# Patient Record
Sex: Male | Born: 1990 | Race: White | Hispanic: No | Marital: Single | Smoking: Current every day smoker
Health system: Southern US, Community
[De-identification: ages and names within clinical notes are randomized; demographics above are authoritative.]

## PROBLEM LIST (undated history)

## (undated) ENCOUNTER — Emergency Department (HOSPITAL_COMMUNITY): Admission: EM | Payer: Self-pay | Source: Home / Self Care

---
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
## 2010-06-13 ENCOUNTER — Emergency Department (HOSPITAL_COMMUNITY)
Admission: EM | Admit: 2010-06-13 | Discharge: 2010-06-13 | Payer: Self-pay | Source: Home / Self Care | Admitting: Emergency Medicine

## 2010-10-10 IMAGING — CT CT PARANASAL SINUSES LIMITED
1 of 2 series · 15 of 30 positions shown, 19 images · non-contrast
Comparison: None.

CLINICAL DATA: 18-year-old male with pain around the eyes for 1
week.  No known injury.

CT PARANASAL SINUS LIMITED WITHOUT CONTRAST
TECHNIQUE: Multidetector CT images of the paranasal sinuses were
obtained in a single plane without contrast.

[Series 3: ltd sinuses 3.0 h40s st · axial · 0.29mm/px · z∈[-214,-136]mm · 15 of 30 slices shown, 19 images]
[im 2/30  brain]
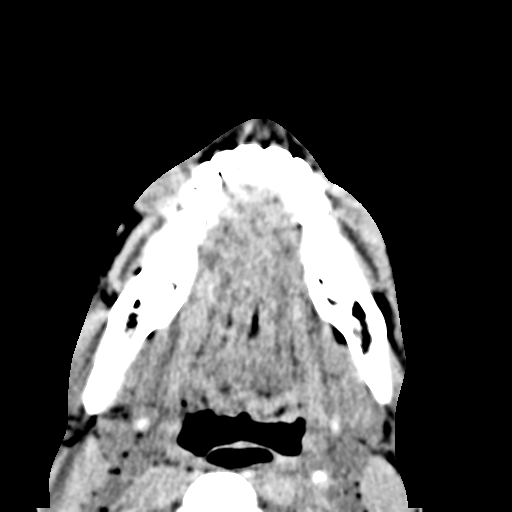
[im 2/30  bone]
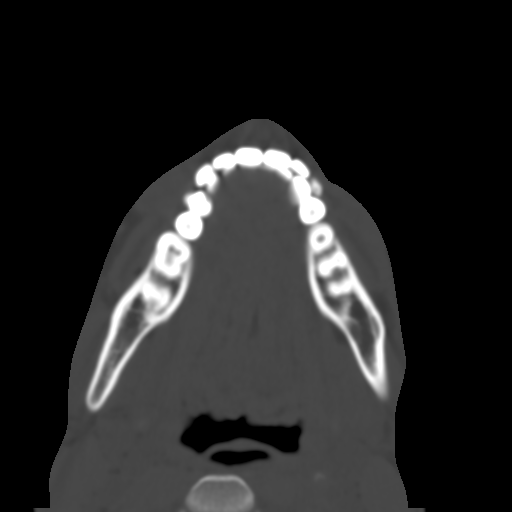
[im 3/30  bone]
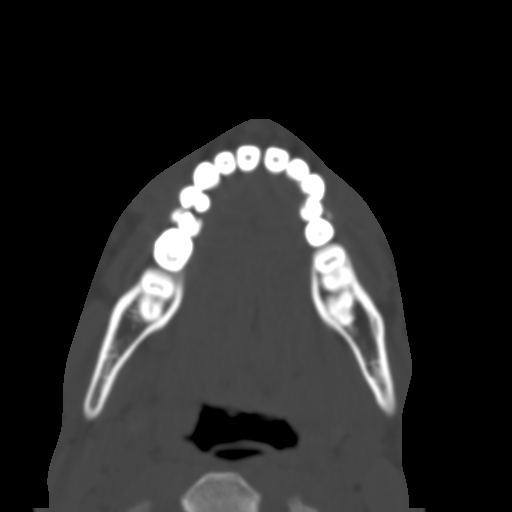
[im 6/30  bone]
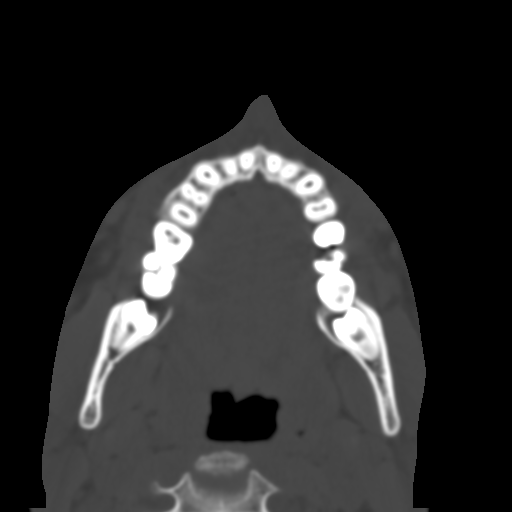
[im 8/30  bone]
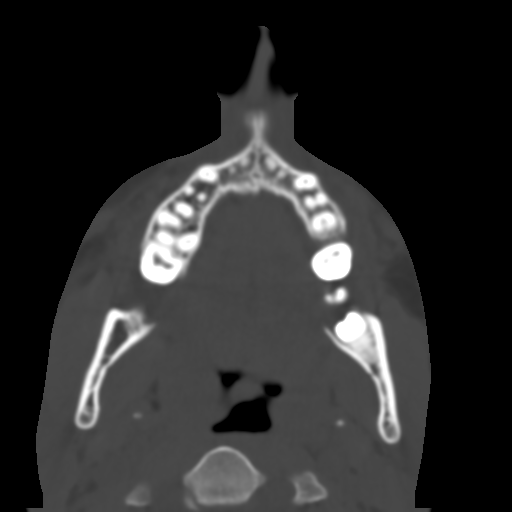
[im 9/30  brain]
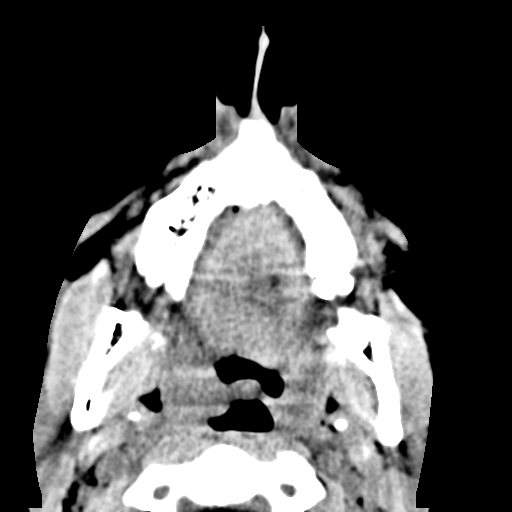
[im 9/30  bone]
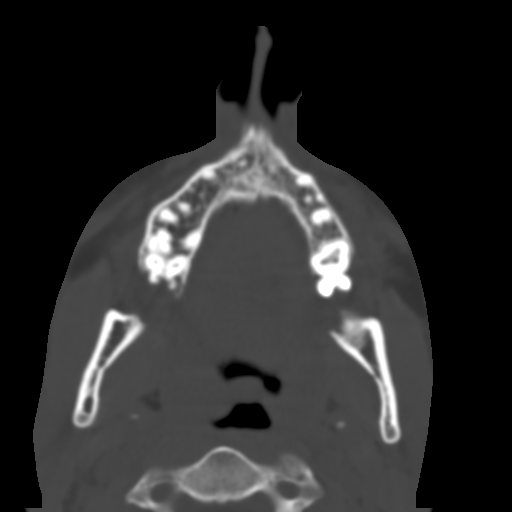
[im 11/30  bone]
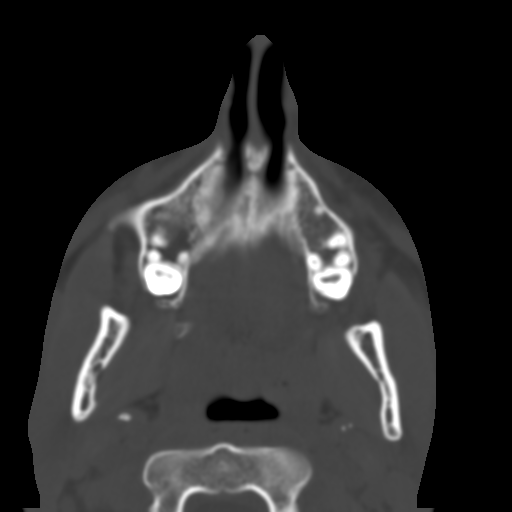
[im 14/30  bone]
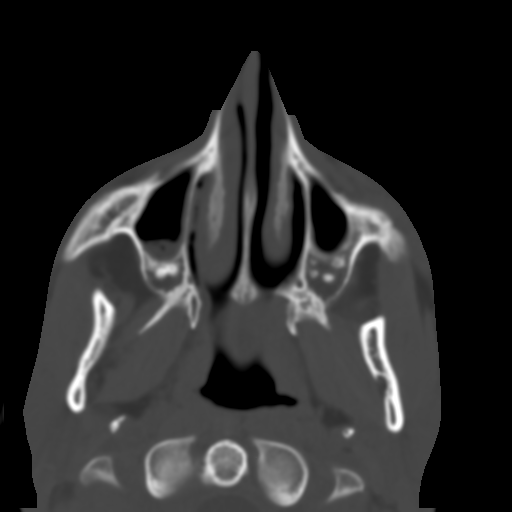
[im 15/30  bone]
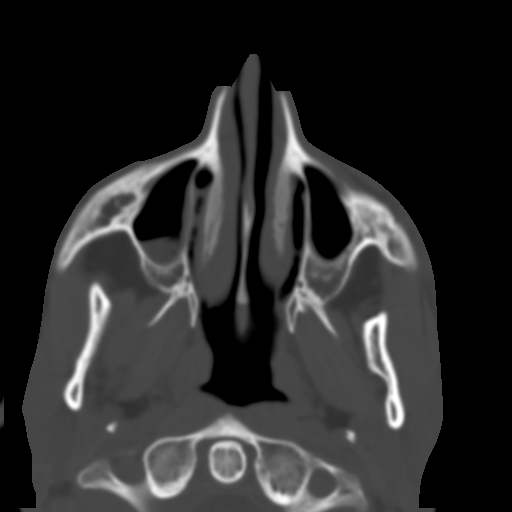
[im 16/30  brain]
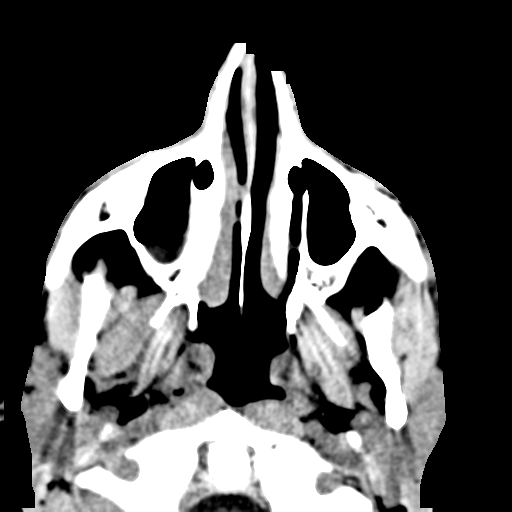
[im 16/30  bone]
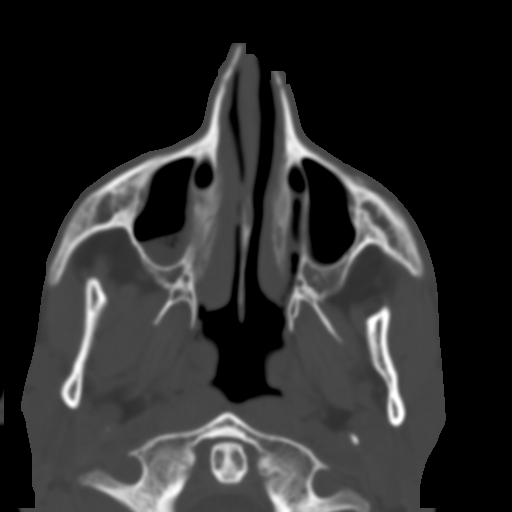
[im 19/30  bone]
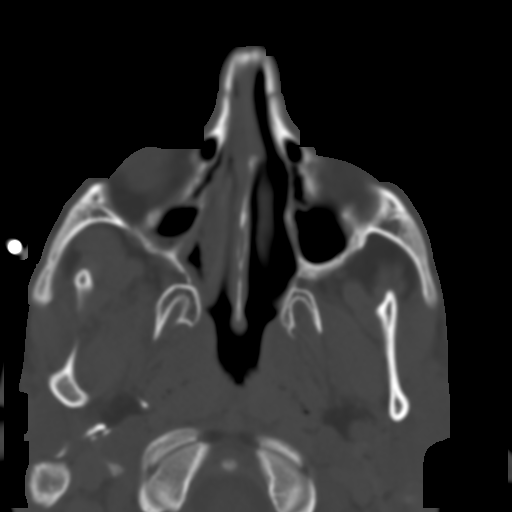
[im 21/30  bone]
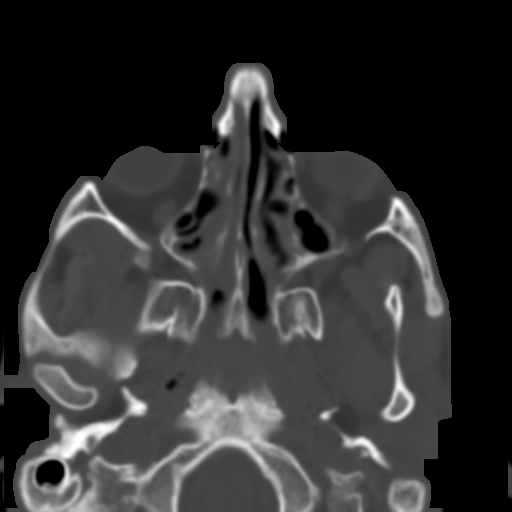
[im 22/30  bone]
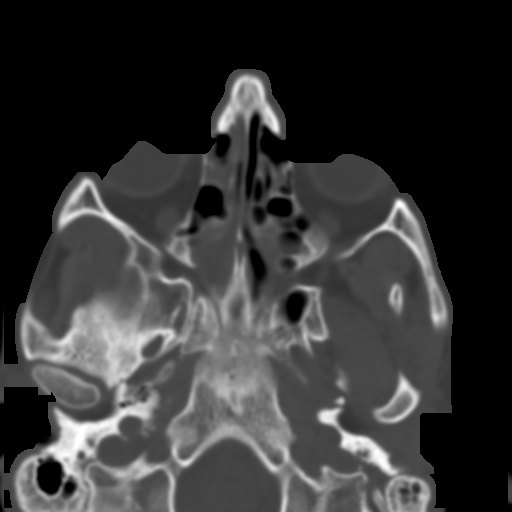
[im 24/30  brain]
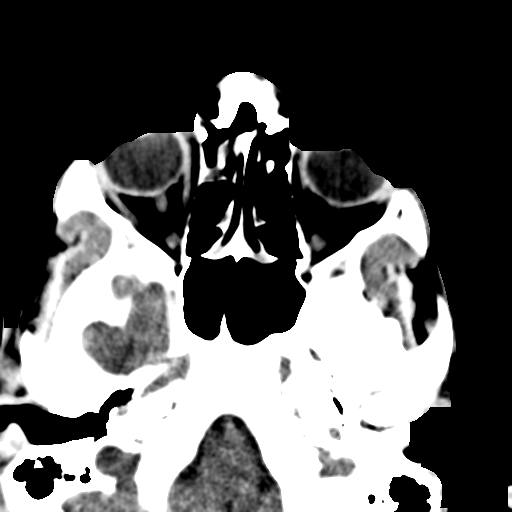
[im 24/30  bone]
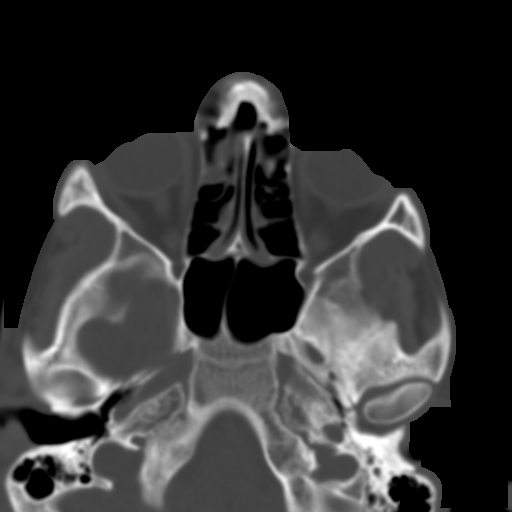
[im 27/30  bone]
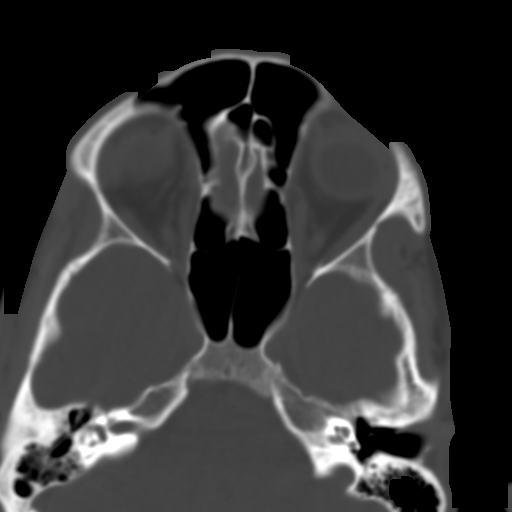
[im 28/30  bone]
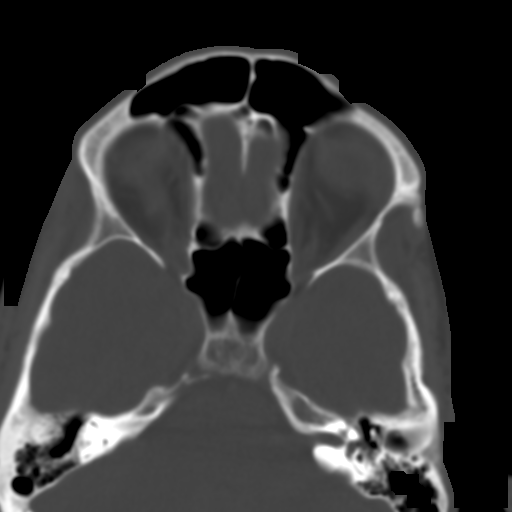

[15 of 30 positions shown; findings below may reference images not displayed]

FINDINGS: Visualized brain parenchyma within normal limits.
Visualized deep soft tissue spaces of the face and neck are within
normal limits.  Mild tonsillar pillar hypertrophy.  Bubbly opacity
and mild mucosal thickening right maxillary sinus.  Mucosal
thickening scattered in the anterior ethmoid right greater than
left.  Frontal sinuses, sphenoid sinuses, tympanic cavities and
mastoids are clear.  Mild mucosal thickening left maxillary sinus.
No acute osseous abnormality identified.  Visualized orbit soft
tissues are within normal limits.
IMPRESSION: 1.  Bubbly opacity right maxillary sinus suspicious for acute
sinusitis.  Mild mucosal thickening bilateral ethmoid and left
maxillary sinus.
2.  No other acute findings identified.

## 2010-10-22 ENCOUNTER — Encounter (HOSPITAL_COMMUNITY): Payer: Self-pay

## 2010-10-22 ENCOUNTER — Emergency Department (HOSPITAL_COMMUNITY)
Admission: EM | Admit: 2010-10-22 | Discharge: 2010-10-22 | Disposition: A | Discharge status: Home / Self Care | Payer: Medicaid Other | Attending: Emergency Medicine | Admitting: Emergency Medicine

## 2010-10-22 ENCOUNTER — Emergency Department (HOSPITAL_COMMUNITY): Payer: Medicaid Other

## 2010-10-22 DIAGNOSIS — W19XXXA Unspecified fall, initial encounter: Secondary | ICD-10-CM | POA: Insufficient documentation

## 2010-10-22 DIAGNOSIS — IMO0002 Reserved for concepts with insufficient information to code with codable children: Secondary | ICD-10-CM | POA: Insufficient documentation

## 2010-10-25 NOTE — Discharge Summary (Signed)
[facility]  Patient:    Johnny Huff, Johnny Huff Visit Number: [id number] MRN: [id number]          Service Type: PSY Location: 600 [id number] Attending Physician:  Veneta Penton Dictated by:   Veneta Penton, M.D. Admit Date:  06/24/2001 Discharge Date: 07/01/2001                             Discharge Summary  REASON FOR ADMISSION:  This 20 year old white male was admitted complaining of depression with suicidal ideation with plan that he refused to discuss.  He had been hoarding knives and a B.B. gun and was threatening to kill his mothers male partner and the partners 31 year old daughter and then himself.  PHYSICAL EXAMINATION:  The patients physical examination, at the time of admission, was significant for a history of having tympanostomy tubes as a young child.  He had an otherwise unremarkable physical examination.  LABORATORY EXAMINATION:  The patient underwent a laboratory workup to rule out any medical problems contributing to his symptomatology.  Hepatic panel was within normal limits.  UA was unremarkable.  Basic metabolic panel was within normal limits.  CBC showed white count of 4.6000 and was otherwise unremarkable.  Free T4 and TSH were within normal limits.  GGT was within normal limits.  Hepatic panel was within normal limits.  The patient received no x-rays, no special procedures, no additional consultations.  He sustained no complications during the course of this hospitalization.  HOSPITAL COURSE:  On admission, the patient was psychomotor retarded with decreased concentration and attention span.  Poor impulse control.  He was easily distracted by extraneous stimuli.  His affect and mood were depressed, irritable, angry and anxious.  His concentration was decreased.  He was started on a trial of Effexor XR in combination of Adderall XR, which he had been taking on admission.  He was titrated up to a therapeutic dose.  He  has actively participated in all aspects of the therapeutic treatment program and, at the time of discharge, the patient denies any suicidal or homicidal ideation.  He has shown no assaultive or aggressive behaviors.  He remains somewhat oppositional and defiant but no longer appears to be a danger to himself or others.  Consequently, he is felt to have reached his maximum benefits of hospitalization and is ready for discharge to a less restrictive alternative setting.  CONDITION ON DISCHARGE:  Improved.  DIAGNOSES:  (According to DSM-IV). Axis I:    1. Major depression, single episode, severe without psychosis.            2. Attention-deficit hyperactivity disorder, combined-type.            3. Oppositional defiant disorder.            4. Rule out adjustment disorder with mixed disturbance of conduct               and emotions. Axis II:   Rule out learning disorder not otherwise specified. Axis III:  None. Axis IV:   Severe. Axis V:    20 on admission; 30 on discharge.  FURTHER EVALUATION AND TREATMENT RECOMMENDATIONS: 1. The patient is discharged to home. 2. He is discharged on an unrestricted level of activity and a regular diet. 3. He is discharged on Adderall XR 20 mg p.o. q.a.m., Effexor XR 75 mg p.o.    q.a.m. with food. 4. He will follow up with his outpatient  psychiatrist for all further aspects    of his psychiatric care and I will sign off on the case at this time. Dictated by:   Veneta Penton, M.D. Attending Physician:  Veneta Penton DD:  07/01/01 TD:  07/01/01 Job: [id number]

## 2010-10-25 NOTE — H&P (Signed)
[facility]  Patient:    Johnny Huff, Johnny Huff Visit Number: [id number] MRN: [id number]          Service Type: PSY Location: 600 [id number] [id number] Attending Physician:  Veneta Penton Dictated by:   Veneta Penton, M.D. Admit Date:  06/24/2001                     Psychiatric Admission Assessment  DATE OF ADMISSION:  June 24, 2001  PATIENT IDENTIFICATION:  This 20 year old white male was admitted complaining of depression with suicidal ideation with a plan he refused to discuss.  He was hoarding knives and BB guns and also threatening to kill his mothers male partner and the partners 69 year old daughter and then himself.  HISTORY OF PRESENT ILLNESS:  The patient complains of an increasingly depressed, irritable, and angry mood most of the day nearly every day over the past several three to six months along with anhedonia, decreased school performance, giving up on activities previously found pleasurable, insomnia, decreased appetite, feelings of hopelessness, helplessness, worthlessness, decreased concentration and energy level, increased symptoms of fatigue, psychomotor agitation, and recurrent thoughts of death.  His current psychosocial stressors reportedly are only the fact that mothers male partner moved into the house three months ago.  The patient apparently walked into mothers bedroom recently while mother and her partner were having sex and became enraged.  The patient reports that he does not want his mother to be gay.  PAST PSYCHIATRIC HISTORY:  Attention-deficit/hyperactivity disorder, oppositional defiant disorder versus a frank conduct disorder.  He has been seen in outpatient treatment at [facility] since January 2002.  He has no other history of psychiatric or neurologic illness.  SUBSTANCE ABUSE HISTORY:  He has no history of drug, alcohol, or tobacco use.  PAST MEDICAL HISTORY:  Tympanostomy tubes as  a child for chronic otitis media.  He has no current medical or surgical problems.  ALLERGIES:  No known drug allergies or sensitivities.  CURRENT MEDICATIONS:  Adderall XR 20 mg p.o. q.d.  FAMILY AND SOCIAL HISTORY:  The patient lives with mother, mothers male lover, and the lovers 55 year old daughter, as well as the patients 27 year old sister.  His father is incarcerated, has a history of antisocial personality disorder and polysubstance dependence.  The patient is currently in the fifth grade.  He reports being rather isolated in that he has no males in the house, has no male friends nearby, and he reports no male role models.  MENTAL STATUS EXAMINATION:  The patient presents as a well-developed, well-nourished, latency age white male who is alert and oriented x 4, cooperative with the evaluation and whose appearance is compatible with his stated age.  He is psychomotor retarded.  Speech is coherent with a decreased rate and volume of speech, increased speech latency.  He displays no looseness of associations or evidence of a thought disorder.  Eye contact is poor. Affect and mood are depressed, irritable, angry, and anxious.  Concentration is decreased as is his attention span.  He displays poor impulse control.  He is easily distracted by extraneous stimuli.  Immediate recall, short-term memory, and remote memory are intact.  Similarities and differences are within normal limits.  Proverbs are somewhat concrete and consistent with educational level.  ADMISSION DIAGNOSES: Axis I:    1. Major depression, single episode, severe without psychosis.            2. Attention-deficit/hyperactivity disorder, combined type.  3. Oppositional defiant disorder.            4. Rule out adjustment disorder with mixed disturbance of               conduct and emotions. Axis II:   Rule out learning disorder, not otherwise specified. Axis III:  None. Axis IV:   Severe. Axis V:     20.  ASSETS AND STRENGTHS:  His mother is very supportive of him. He is well connected into the DSS system.  INITIAL PLAN OF CARE:  Begin the patient on a trial of Effexor XR and continue on his present dose of Adderall XR.  Psychotherapy will focus on improving his impulse control, decreasing cognitive distortions, decreasing potential for harm to self and others.  A laboratory workup will also be initiated to rule out any medical problems contributing to his symptomatology.  ESTIMATED LENGTH OF STAY:  Five to seven days.  POST HOSPITAL CARE PLAN:  Discharge the patient to home.Dictated by:   Veneta Penton, M.D. Attending Physician:  Veneta Penton DD:  06/25/01 TD:  06/25/01 Job: [id number]

## 2010-11-05 ENCOUNTER — Emergency Department (HOSPITAL_COMMUNITY)
Admission: EM | Admit: 2010-11-05 | Discharge: 2010-11-05 | Disposition: A | Discharge status: Home / Self Care | Payer: Medicaid Other | Attending: Emergency Medicine | Admitting: Emergency Medicine

## 2010-11-05 DIAGNOSIS — Z23 Encounter for immunization: Secondary | ICD-10-CM | POA: Insufficient documentation

## 2010-11-05 DIAGNOSIS — IMO0001 Reserved for inherently not codable concepts without codable children: Secondary | ICD-10-CM | POA: Insufficient documentation

## 2011-05-26 ENCOUNTER — Emergency Department (HOSPITAL_COMMUNITY)
Admission: EM | Admit: 2011-05-26 | Discharge: 2011-05-26 | Discharge status: Against Medical Advice | Payer: Self-pay | Attending: Emergency Medicine | Admitting: Emergency Medicine

## 2011-05-26 DIAGNOSIS — Z0389 Encounter for observation for other suspected diseases and conditions ruled out: Secondary | ICD-10-CM | POA: Insufficient documentation

## 2012-05-28 IMAGING — CR DG HAND COMPLETE 3+V*R*
3 series · 3 of 3 positions shown · non-contrast
Comparison: None.

CLINICAL DATA: 19-year-old male status post fall with pain.

RIGHT HAND - COMPLETE 3+ VIEW

[x hand ap right]
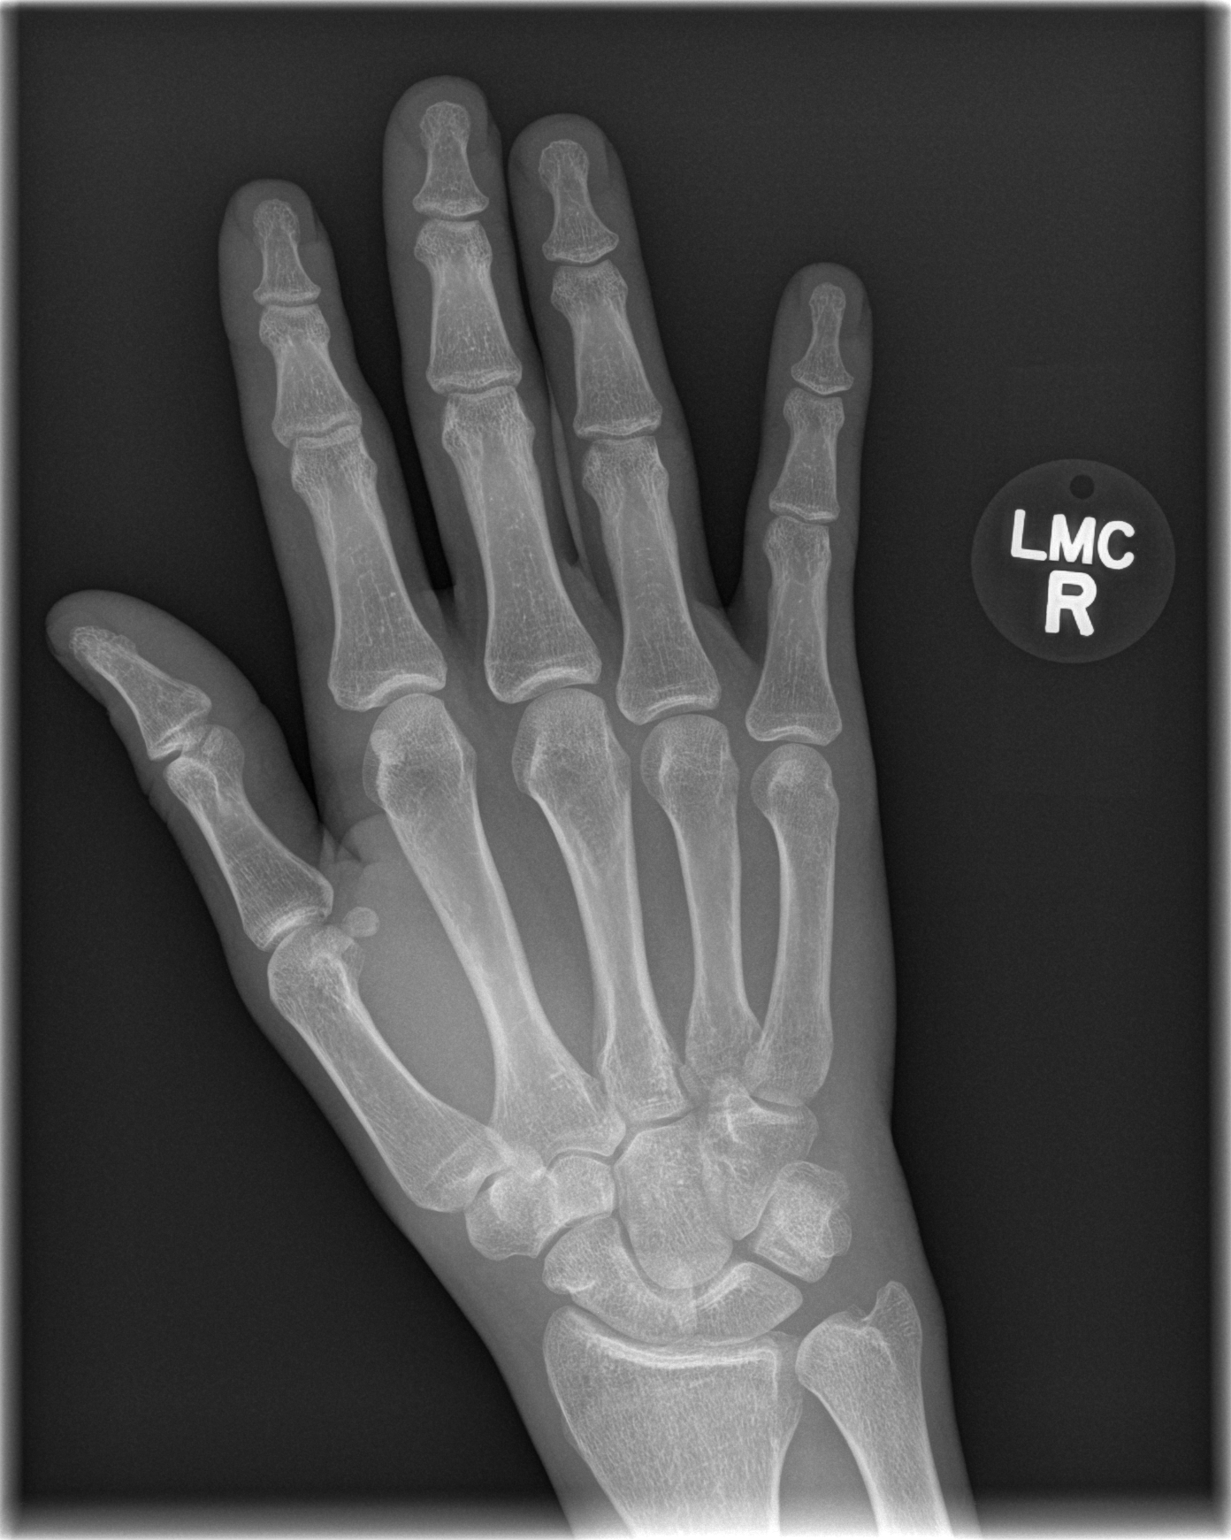

[x hand oblique right]
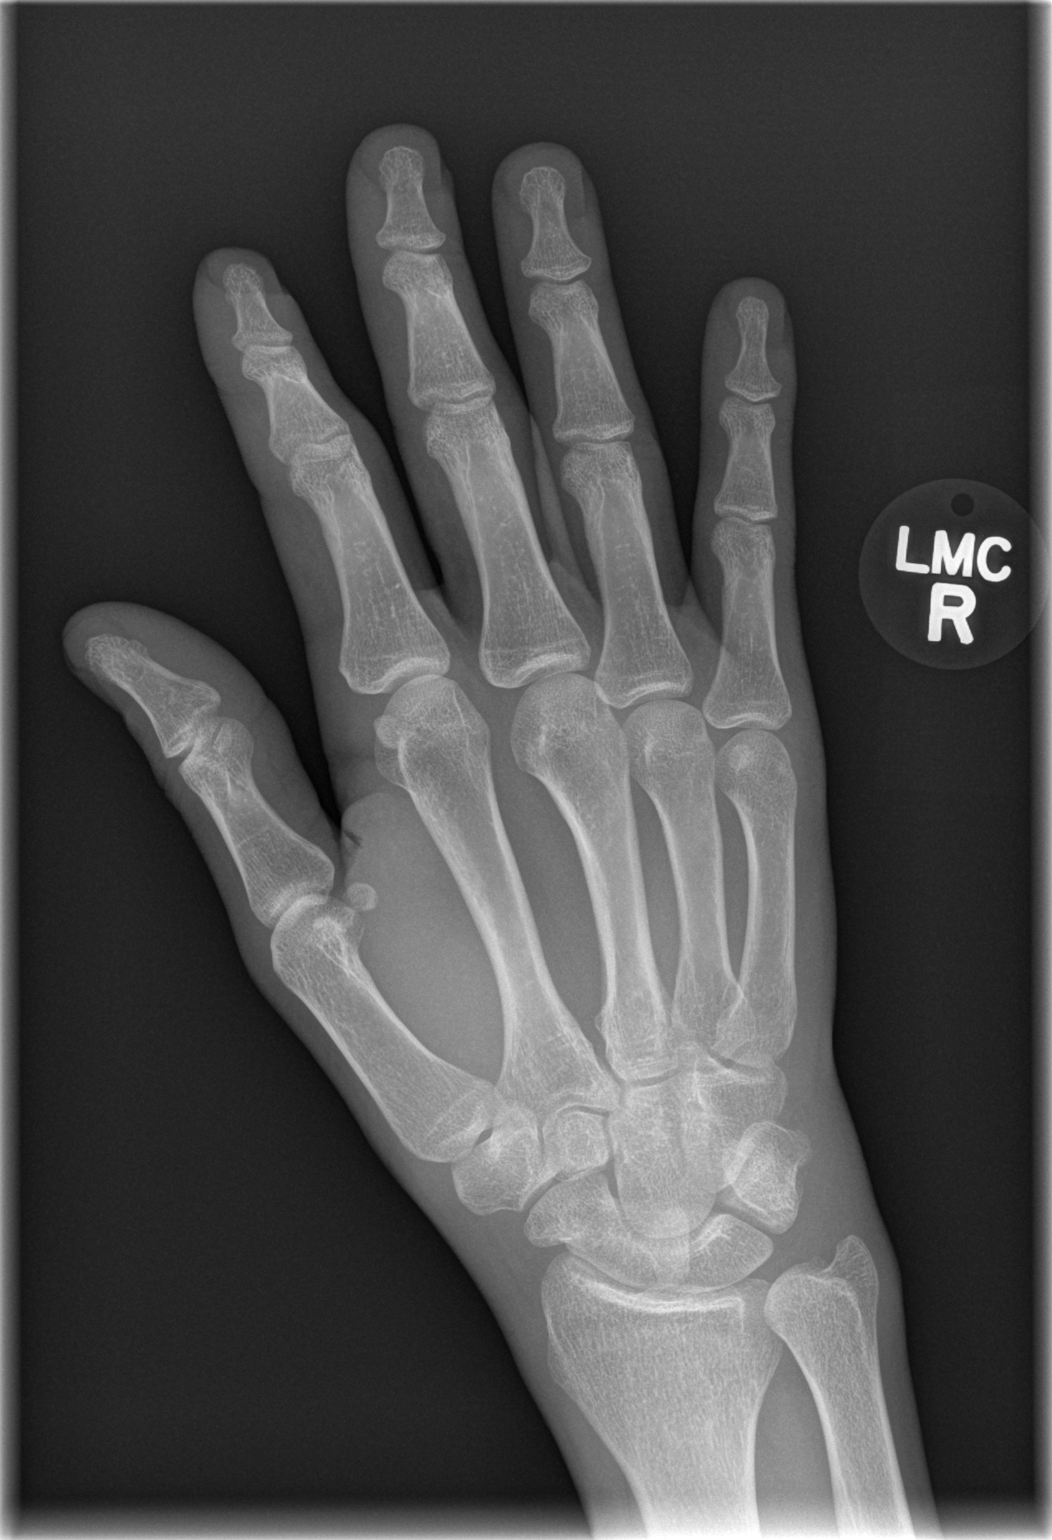

[x hand lat right]
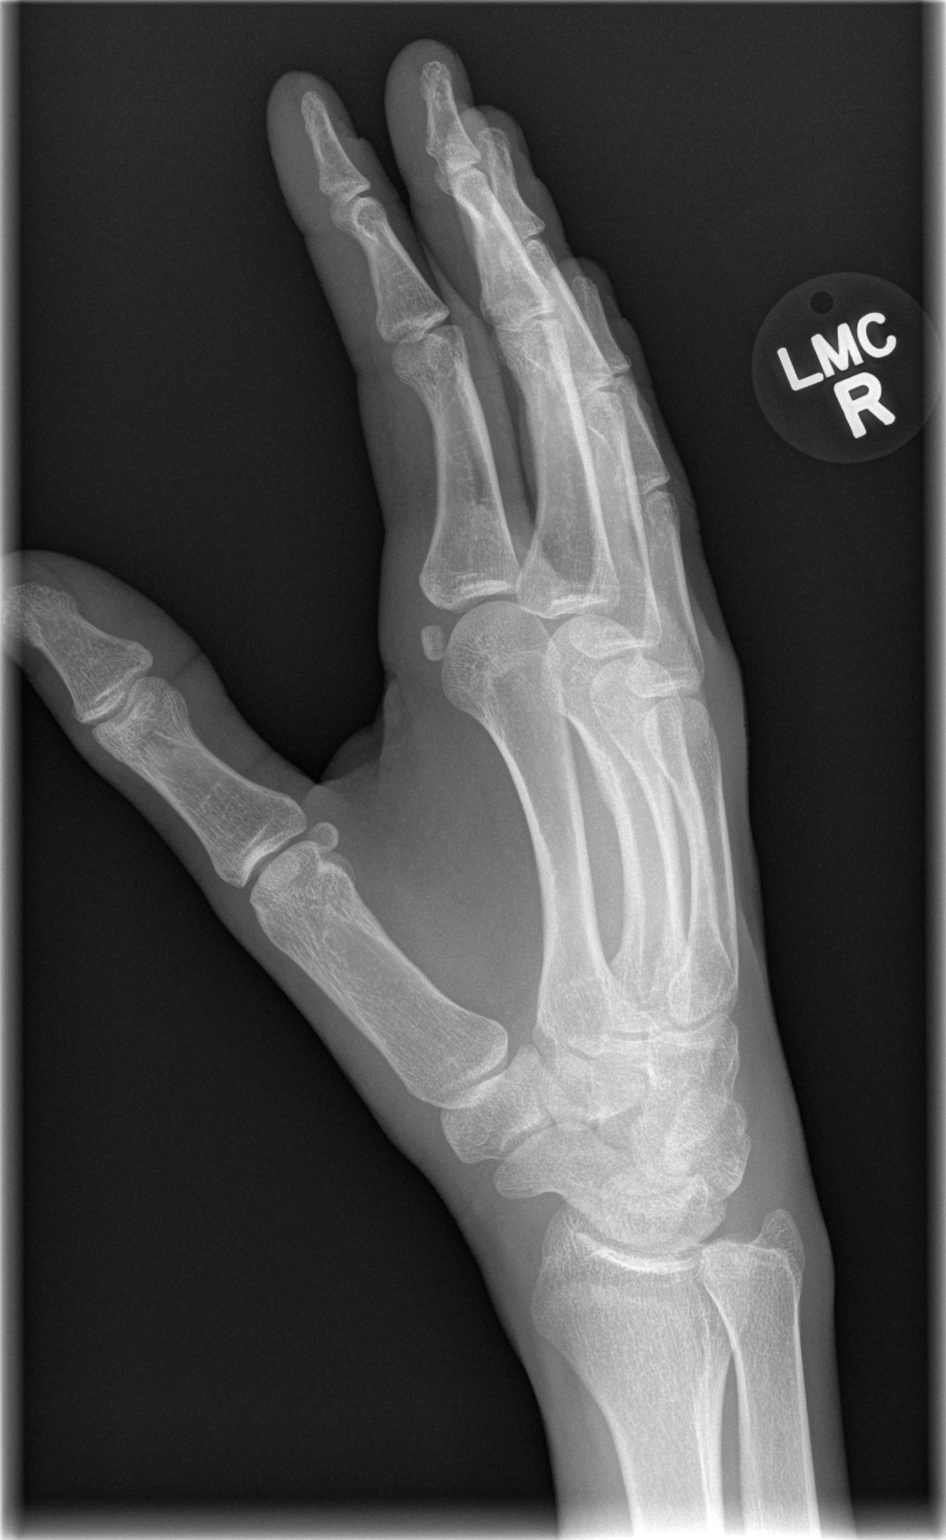

[3 of 3 positions shown; findings below may reference images not displayed]

FINDINGS: Bone mineralization is within normal limits.  Normal
joint spaces.  Distal radius and ulna intact.  Carpal bone
alignment within normal limits, no true lateral provided.  No acute
fracture or dislocation.
IMPRESSION: No acute fracture or dislocation identified about the right hand.

## 2013-04-09 ENCOUNTER — Encounter (HOSPITAL_COMMUNITY): Payer: Self-pay | Admitting: Emergency Medicine

## 2013-04-09 ENCOUNTER — Emergency Department (HOSPITAL_COMMUNITY)
Admission: EM | Admit: 2013-04-09 | Discharge: 2013-04-09 | Disposition: A | Discharge status: Home / Self Care | Payer: Medicaid Other | Attending: Emergency Medicine | Admitting: Emergency Medicine

## 2013-04-09 DIAGNOSIS — K0889 Other specified disorders of teeth and supporting structures: Secondary | ICD-10-CM

## 2013-04-09 DIAGNOSIS — K089 Disorder of teeth and supporting structures, unspecified: Secondary | ICD-10-CM | POA: Insufficient documentation

## 2013-04-09 DIAGNOSIS — F17200 Nicotine dependence, unspecified, uncomplicated: Secondary | ICD-10-CM | POA: Insufficient documentation

## 2013-04-09 MED ORDER — HYDROCODONE-ACETAMINOPHEN 5-325 MG PO TABS
1.0000 | ORAL_TABLET | Freq: Once | ORAL | Status: AC
  Administered 2013-04-09: 1 via ORAL
  Filled 2013-04-09: qty 1

## 2013-04-09 MED ORDER — IBUPROFEN 800 MG PO TABS: 800.0000 mg | ORAL_TABLET | Freq: Three times a day (TID) | ORAL | Status: DC

## 2013-04-09 MED ORDER — HYDROCODONE-ACETAMINOPHEN 5-325 MG PO TABS: 1.0000 | ORAL_TABLET | ORAL | Status: DC | PRN

## 2013-04-09 MED ORDER — PENICILLIN V POTASSIUM 250 MG PO TABS
500.0000 mg | ORAL_TABLET | Freq: Once | ORAL | Status: AC
  Administered 2013-04-09: 500 mg via ORAL
  Filled 2013-04-09: qty 2

## 2013-04-09 MED ORDER — PENICILLIN V POTASSIUM 500 MG PO TABS: 500.0000 mg | ORAL_TABLET | Freq: Three times a day (TID) | ORAL | Status: DC

## 2013-04-09 NOTE — ED Notes (Signed)
Pt reports right side upper dental pain x 2 weeks, airway intact.

## 2013-04-09 NOTE — ED Provider Notes (Signed)
Medical screening examination/treatment/procedure(s) were conducted as a shared visit with non-physician practitioner(s) or resident and myself. I personally evaluated the patient during the encounter and agree with the findings and plan unless otherwise indicated.  I have personally reviewed any xrays and/ or EKG's with the provider and I agree with interpretation.  Right upper dental pain 2 wks. Fractured tooth. Smoker. Pt does not have dentist.  Fractured tooth, no pulp visualized, mild tender gingiva, No trismus, uvular deviation, unilateral posterior pharyngeal edema or submandibular swelling. PO abx, pain meds and fup with dentist discussed.  Smoking cessation and outpatient follow up was discussed with the patient.  Dental pain  Enid Skeens, MD 04/09/13 [phone]

## 2013-04-09 NOTE — ED Provider Notes (Signed)
CSN: [id number]     Arrival date & time 04/09/13  4098 History  This chart was scribed for non-physician practitioner Allean Found, PA-C working with Enid Skeens, MD by Valera Castle, ED scribe. This patient was seen in room A10C/A10C and the patient's care was started at 9:47 AM.    Chief Complaint  Patient presents with  . Dental Pain    The history is provided by the patient and the spouse. No language interpreter was used.   HPI Comments: Johnny Huff is a 22 y.o. male who presents to the Emergency Department complaining of sudden, moderate, constant, upper, right dental pain, onset 1 week ago. He reports having a broken tooth. He reports associated right sided facial pain and right sided headache. He denies taking anything for the pain. He denies fever, and any other associated symptoms. He denies any allergies. He reports being an every day smoker. He denies any medical history.    History reviewed. No pertinent past medical history. History reviewed. No pertinent past surgical history. History reviewed. No pertinent family history. History  Substance Use Topics  . Smoking status: Current Every Day Smoker    Types: Cigarettes  . Smokeless tobacco: Not on file  . Alcohol Use: No    Review of Systems  Constitutional: Negative for fever.  HENT: Positive for dental problem (upper, right dental pain) and facial swelling (right sided).   All other systems reviewed and are negative.    Allergies  Review of patient's allergies indicates no known allergies.  Home Medications  No current outpatient prescriptions on file.  Triage Vitals: BP 121/61  Pulse 80  Temp(Src) 98.1 F (36.7 C) (Oral)  Resp 16  SpO2 99% Physical Exam  Nursing note and vitals reviewed. Constitutional: He is oriented to person, place, and time. He appears well-developed and well-nourished. No distress.  HENT:  Head: Normocephalic and atraumatic.  Mouth/Throat: Oropharynx is clear and moist.   +4 fx. Minimal gingival erythema without pointing abscess. Mild right facial swelling. No adenopathy.   Eyes: EOM are normal.  Cardiovascular: Normal rate.   Pulmonary/Chest: Effort normal. No respiratory distress.  Neurological: He is alert and oriented to person, place, and time.  Skin: Skin is warm and dry.  Psychiatric: He has a normal mood and affect. His behavior is normal.    ED Course  Procedures (including critical care time)  DIAGNOSTIC STUDIES: Oxygen Saturation is 99% on room air, normal by my interpretation.    COORDINATION OF CARE: 9:53 AM-Discussed treatment plan which includes Hydrocodone, Veetid, and Ibuprofen with pt at bedside and pt agreed to plan. Will refer to dental specialist.  Labs Review Labs Reviewed - No data to display Imaging Review No results found.  EKG Interpretation   None      Meds ordered this encounter  Medications  . HYDROcodone-acetaminophen (NORCO/VICODIN) 5-325 MG per tablet 1 tablet    Sig:   . penicillin v potassium (VEETID) tablet 500 mg    Sig:   . HYDROcodone-acetaminophen (NORCO/VICODIN) 5-325 MG per tablet    Sig: Take 1 tablet by mouth every 4 (four) hours as needed for pain.    Dispense:  10 tablet    Refill:  0    Order Specific Question:  Supervising Provider    Answer:  Eber Hong D [[id number]]  . penicillin v potassium (VEETID) 500 MG tablet    Sig: Take 1 tablet (500 mg total) by mouth 3 (three) times daily.    Dispense:  30 tablet    Refill:  0    Order Specific Question:  Supervising Provider    Answer:  Eber Hong D [[id number]]  . ibuprofen (ADVIL,MOTRIN) 800 MG tablet    Sig: Take 1 tablet (800 mg total) by mouth 3 (three) times daily.    Dispense:  21 tablet    Refill:  0    Order Specific Question:  Supervising Provider    Answer:  Eber Hong D [[id number]]    MDM  No diagnosis found. 1. Dental pain  Facial swelling with dental erosion. Will cover with abx, pain medication.   I personally performed the  services described in this documentation, which was scribed in my presence. The recorded information has been reviewed and is accurate.     Arnoldo Hooker, PA-C 04/09/13 1358

## 2013-05-14 ENCOUNTER — Encounter (HOSPITAL_COMMUNITY): Payer: Self-pay | Admitting: Emergency Medicine

## 2013-05-14 ENCOUNTER — Emergency Department (HOSPITAL_COMMUNITY)
Admission: EM | Admit: 2013-05-14 | Discharge: 2013-05-14 | Disposition: A | Discharge status: Home / Self Care | Payer: Medicaid Other | Attending: Emergency Medicine | Admitting: Emergency Medicine

## 2013-05-14 DIAGNOSIS — R0602 Shortness of breath: Secondary | ICD-10-CM | POA: Insufficient documentation

## 2013-05-14 DIAGNOSIS — J069 Acute upper respiratory infection, unspecified: Secondary | ICD-10-CM

## 2013-05-14 DIAGNOSIS — R51 Headache: Secondary | ICD-10-CM | POA: Insufficient documentation

## 2013-05-14 DIAGNOSIS — F17200 Nicotine dependence, unspecified, uncomplicated: Secondary | ICD-10-CM | POA: Insufficient documentation

## 2013-05-14 MED ORDER — HYDROCODONE-ACETAMINOPHEN 7.5-325 MG/15ML PO SOLN: 10.0000 mL | Freq: Four times a day (QID) | ORAL | Status: AC | PRN

## 2013-05-14 MED ORDER — IBUPROFEN 800 MG PO TABS: 800.0000 mg | ORAL_TABLET | Freq: Three times a day (TID) | ORAL | Status: DC | PRN

## 2013-05-14 NOTE — ED Notes (Signed)
Pt states he has sore throat x3 days. Pt reports difficulty swallowing as well. NAD noted.

## 2013-05-14 NOTE — ED Provider Notes (Signed)
CSN: [id number]     Arrival date & time 05/14/13  1817 History  This chart was scribed for non-physician practitioner Trixie Dredge, PA-C working with Gerhard Munch, MD by Valera Castle, ED scribe. This patient was seen in room TR11C/TR11C and the patient's care was started at 7:48 PM.   Chief Complaint  Patient presents with  . Sore Throat   The history is provided by the patient. No language interpreter was used.   HPI Comments: Johnny Huff is a 22 y.o. male who presents to the Emergency Department complaining of sudden, moderate sore throat, with associated pain with swallowing, onset 4 days ago. He reports associated headaches, cough, productive of yellow sputum, nasal congestion, and rhinorrhea. He reports taking Dayquil, with no relief. He reports recently having his tooth pulled out, and denies any complications from the procedure, including infection and drainage. He reports his 22 year old son has been sick recently. He denies fever, chills, nausea, emesis, and any other associated symptoms. He reports a h/o smoking. Pt has no medical history.  PCP - No PCP Per Patient  History reviewed. No pertinent past medical history. History reviewed. No pertinent past surgical history. No family history on file. History  Substance Use Topics  . Smoking status: Current Every Day Smoker -- 1.00 packs/day    Types: Cigarettes  . Smokeless tobacco: Not on file  . Alcohol Use: No    Review of Systems  Constitutional: Negative for fever and chills.  HENT: Positive for congestion, rhinorrhea, sinus pressure, sore throat and trouble swallowing.   Respiratory: Positive for cough (productive of yellow sputum) and shortness of breath.   Gastrointestinal: Negative for nausea and vomiting.  Neurological: Positive for headaches.    Allergies  Review of patient's allergies indicates no known allergies.  Home Medications   No current outpatient prescriptions on file.  Triage Vitals: BP 111/61   Pulse 98  Temp(Src) 98.6 F (37 C) (Oral)  Resp 20  SpO2 98%  Physical Exam  Nursing note and vitals reviewed. Constitutional: He is oriented to person, place, and time. He appears well-developed and well-nourished. No distress.  HENT:  Head: Atraumatic.  Right Ear: External ear normal.  Left Ear: External ear normal.  Nose: Nose normal. Right sinus exhibits no maxillary sinus tenderness and no frontal sinus tenderness. Left sinus exhibits no maxillary sinus tenderness and no frontal sinus tenderness.  Mouth/Throat: Uvula is midline and mucous membranes are normal. Mucous membranes are not dry. No uvula swelling. Posterior oropharyngeal edema and posterior oropharyngeal erythema present. No oropharyngeal exudate or tonsillar abscesses.  Eyes: EOM are normal.  Neck: Normal range of motion. Neck supple. No tracheal deviation present.  Cardiovascular: Normal rate, regular rhythm and normal heart sounds.  Exam reveals no gallop and no friction rub.   No murmur heard. Pulmonary/Chest: Effort normal and breath sounds normal. No stridor. No respiratory distress. He has no wheezes. He has no rales.  Lymphadenopathy:    He has cervical adenopathy (Mild anterior cervical adenopathy).  Neurological: He is alert and oriented to person, place, and time.  Skin: Skin is warm and dry. He is not diaphoretic.  Psychiatric: He has a normal mood and affect. His behavior is normal.    ED Course  Procedures (including critical care time)  DIAGNOSTIC STUDIES: Oxygen Saturation is 98% on room air, normal by my interpretation.    COORDINATION OF CARE: 7:51 PM-Discussed treatment plan which includes Hycet and Advil/Motrin with pt at bedside and pt agreed to  plan.   Labs Review Labs Reviewed - No data to display Imaging Review No results found.  EKG Interpretation   None       MDM   1. URI (upper respiratory infection)    Pt with sore throat, cough, nasal congestion x 4 days.  AFebrile,  nontoxic.  +sick contact in 65 year old son.  Per centor criteria, no test or antibiotic tx necessary.  Likely viral.  No airway concerns.  D/C home with lortab elixir (10 doses) and ibuprofen. Discussed  findings, treatment, and follow up  with patient.  Pt given return precautions.  Pt verbalizes understanding and agrees with plan.        I personally performed the services described in this documentation, which was scribed in my presence. The recorded information has been reviewed and is accurate.    Trixie Dredge, PA-C 05/14/13 [phone]

## 2013-05-15 NOTE — ED Provider Notes (Signed)
  Medical screening examination/treatment/procedure(s) were performed by non-physician practitioner and as supervising physician I was immediately available for consultation/collaboration.  EKG Interpretation   None          Kynlea Blackston, MD 05/15/13 0033 

## 2014-12-06 ENCOUNTER — Encounter (HOSPITAL_COMMUNITY): Payer: Self-pay | Admitting: *Deleted

## 2014-12-06 ENCOUNTER — Emergency Department (HOSPITAL_COMMUNITY): Payer: Medicaid Other

## 2014-12-06 ENCOUNTER — Emergency Department (HOSPITAL_COMMUNITY)
Admission: EM | Admit: 2014-12-06 | Discharge: 2014-12-06 | Disposition: A | Discharge status: Home / Self Care | Payer: Medicaid Other | Attending: Emergency Medicine | Admitting: Emergency Medicine

## 2014-12-06 DIAGNOSIS — Z720 Tobacco use: Secondary | ICD-10-CM | POA: Insufficient documentation

## 2014-12-06 DIAGNOSIS — M940 Chondrocostal junction syndrome [Tietze]: Secondary | ICD-10-CM | POA: Insufficient documentation

## 2014-12-06 LAB — CBC
HCT: 39.6 % (ref 39.0–52.0)
HEMOGLOBIN: 13.4 g/dL (ref 13.0–17.0)
MCH: 31.2 pg (ref 26.0–34.0)
MCHC: 33.8 g/dL (ref 30.0–36.0)
MCV: 92.3 fL (ref 78.0–100.0)
Platelets: 164 10*3/uL (ref 150–400)
RBC: 4.29 MIL/uL (ref 4.22–5.81)
RDW: 13.1 % (ref 11.5–15.5)
WBC: 7.3 10*3/uL (ref 4.0–10.5)

## 2014-12-06 LAB — HEPATIC FUNCTION PANEL
ALT: 13 U/L — ABNORMAL LOW (ref 17–63)
AST: 22 U/L (ref 15–41)
Albumin: 4.2 g/dL (ref 3.5–5.0)
Alkaline Phosphatase: 55 U/L (ref 38–126)
BILIRUBIN DIRECT: 0.1 mg/dL (ref 0.1–0.5)
BILIRUBIN TOTAL: 0.3 mg/dL (ref 0.3–1.2)
Indirect Bilirubin: 0.2 mg/dL — ABNORMAL LOW (ref 0.3–0.9)
Total Protein: 6.8 g/dL (ref 6.5–8.1)

## 2014-12-06 LAB — BASIC METABOLIC PANEL
Anion gap: 10 (ref 5–15)
BUN: 9 mg/dL (ref 6–20)
CO2: 23 mmol/L (ref 22–32)
CREATININE: 0.85 mg/dL (ref 0.61–1.24)
Calcium: 8.9 mg/dL (ref 8.9–10.3)
Chloride: 105 mmol/L (ref 101–111)
GFR calc non Af Amer: >60 mL/min (ref >60)
Glucose, Bld: 95 mg/dL (ref 65–99)
Potassium: 3.9 mmol/L (ref 3.5–5.1)
Sodium: 138 mmol/L (ref 135–145)

## 2014-12-06 LAB — TROPONIN I

## 2014-12-06 LAB — I-STAT TROPONIN, ED: Troponin i, poc: 0 ng/mL (ref 0.00–0.08)

## 2014-12-06 LAB — MAGNESIUM: Magnesium: 2.1 mg/dL (ref 1.7–2.4)

## 2014-12-06 LAB — CK: Total CK: 207 U/L (ref 49–397)

## 2014-12-06 MED ORDER — METHOCARBAMOL 500 MG PO TABS: 500.0000 mg | ORAL_TABLET | Freq: Two times a day (BID) | ORAL | Status: AC

## 2014-12-06 MED ORDER — NAPROXEN 500 MG PO TABS: 500.0000 mg | ORAL_TABLET | Freq: Two times a day (BID) | ORAL | Status: DC

## 2014-12-06 MED ORDER — SODIUM CHLORIDE 0.9 % IV BOLUS (SEPSIS): 1000.0000 mL | Freq: Once | INTRAVENOUS | Status: DC

## 2014-12-06 NOTE — ED Notes (Signed)
Patient returned from X-ray 

## 2014-12-06 NOTE — ED Notes (Signed)
Patient refusing IV fluids due to not wanting an IV insertion. Notified Laveda Normanran, [place].

## 2014-12-06 NOTE — ED Provider Notes (Signed)
MSE was attempted at  3:37 PM on December 06, 2014 - [facility]  His companion notes that he has felt poorly for one week.   Gerhard Munchobert Saafir Abdullah, MD 12/06/14 1539

## 2014-12-06 NOTE — ED Provider Notes (Signed)
CSN: [id number]     Arrival date & time 12/06/14  1512 History   None    Chief Complaint  Patient presents with  . Chest Pain     (Consider location/radiation/quality/duration/timing/severity/associated sxs/prior Treatment) HPI   24 y/o male with no significant past medical history who presents for evaluation of chest pain. 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 For the past week he has had persistent left-sided chest pain which he described as sharp sensation, worsening with movement and occasionally with taking deep breath. Pain is currently 8 out of 10. Pain is nonradiating. Patient is a smoker, and does endorse occasional productive cough with white sputum. No associated lightheadedness, dizziness, diaphoresis, nausea. No specific treatment tried. Increasing pain today while walking prompting him to come to the ER. He denies having any significant cardiac history, no strong family history of premature cardiac death. No prior history of PE or DVT, no recent surgery, prolonged bed rest, unilateral leg swelling or calf pain, active cancer, or hemoptysis. Patient denies any cocaine use but does endorse occasional marijuana use. Denies alcohol abuse.  History reviewed. No pertinent past medical history. History reviewed. No pertinent past surgical history. History reviewed. No pertinent family history. History  Substance Use Topics  . Smoking status: Current Every Day Smoker -- 1.00 packs/day    Types: Cigarettes  . Smokeless tobacco: Not on file  . Alcohol Use: No    Review of Systems  All other systems reviewed and are negative.     Allergies  Review of patient's allergies indicates no known allergies.  Home Medications   Prior to Admission medications   Medication Sig Start Date End Date Taking? Authorizing Provider  ibuprofen (ADVIL,MOTRIN) 800 MG tablet Take 1 tablet (800 mg total) by mouth every 8 (eight) hours as  needed for mild pain or moderate pain. 05/14/13   Trixie Dredge, PA-C   BP 119/69 mmHg  Pulse 69  Temp(Src) 97.6 F (36.4 C) (Oral)  Resp 16  SpO2 99% Physical Exam  Constitutional: He is oriented to person, place, and time. He appears well-developed and well-nourished. No distress.  Tall andThin Caucasian male, resting comfortably, in no acute distress.  HENT:  Head: Atraumatic.  Eyes: Conjunctivae are normal.  Neck: Neck supple. No JVD present. No tracheal deviation present.  Cardiovascular: Normal rate, regular rhythm and intact distal pulses.  Exam reveals no gallop and no friction rub.   No murmur heard. Pulmonary/Chest: Effort normal and breath sounds normal. No respiratory distress. He has no wheezes. He has no rales. He exhibits tenderness (Tenderness to left anterior chest wall on palpation without crepitus or emphysema. No overlying skin changes.).  Abdominal: Soft. There is no tenderness.  Musculoskeletal: He exhibits no edema.  Bilateral lower extremities without palpable cords, erythema, edema.  Neurological: He is alert and oriented to person, place, and time.  Skin: No rash noted.  Psychiatric: He has a normal mood and affect.  Nursing note and vitals reviewed.   ED Course  Procedures (including critical care time)  Patient here with persistent left-sided chest wall pain, reproducible on exam. Pain appears to be musculoskeletal. Initial EKG with diffuse ST elevation which may indicate acute MI. I discussed this with Dr. Jeraldine Loots.  We felt this likely early repolization.  I have low suspicion for ACS.  HEART score is 2.  PERC negative.    4:46 PM Normal troponin, normal magnesium level, normal total CK, labs  otherwise reassuring, no anemia or leukocytosis. Chest x-ray unremarkable and no evidence of pneumothorax or pneumonia. Doubt acute emergent medical condition from this visit. Suspect costochondritis has pain is reproducible on exam and patient performing glabrous job.  I recommend patient to stay hydrated, rice therapy, and outpatient follow-up. Strict return precautions including dyspnea on exertion, hemoptysis, fever, worsening of his condition.   We have also repeat EKG and discussed with Dr. Effie ShyWentz.  Does not suspect ACS at this time.  Labs Review Labs Reviewed  HEPATIC FUNCTION PANEL - Abnormal; Notable for the following:    ALT 13 (*)    Indirect Bilirubin 0.2 (*)    All other components within normal limits  CBC  BASIC METABOLIC PANEL  TROPONIN I  MAGNESIUM  CK  I-STAT TROPOININ, ED    Imaging Review Dg Chest 2 View  12/06/2014   CLINICAL DATA:  One week history of left-sided chest pain  EXAM: CHEST  2 VIEW  COMPARISON:  None.  FINDINGS: The lungs are clear. The heart size and pulmonary vascularity are normal. No adenopathy. No pneumothorax. No bone lesions.  IMPRESSION: No abnormality noted.   Electronically Signed   By: Bretta BangWilliam  Woodruff III M.D.   On: 12/06/2014 15:49     EKG Interpretation   Date/Time:  Wednesday December 06 2014 15:17:12 EDT Ventricular Rate:  77 PR Interval:  130 QRS Duration: 98 QT Interval:  358 QTC Calculation: 405 R Axis:   103 Text Interpretation:   Critical Test Result: STEMI  Suspect arm lead  reversal, interpretation assumes no reversal Normal sinus rhythm with  sinus arrhythmia Rightward axis ST elevation consider inferior injury or  acute infarct Consider right ventricular involvement in acute inferior  infarct Abnormal ECG Left ventricular hypertrophy Early repolarization  pattern T wave abnormality Abnormal ekg Confirmed by Gerhard MunchLOCKWOOD, ROBERT  MD  [phone]([id number]) on 12/06/2014 3:34:30 PM      MDM   Final diagnoses:  Costochondritis    BP 122/65 mmHg  Pulse 72  Temp(Src) 97.6 F (36.4 C) (Oral)  Resp 26  SpO2 100%  I have reviewed nursing notes and vital signs. I personally viewed the imaging tests through PACS system and agrees with radiologist's intepretation I reviewed available ER/hospitalization  records through the EMR  `  Fayrene HelperBowie Tesia Lybrand, PA-C 12/06/14 1707  Mancel BaleElliott Wentz, MD 12/07/14 [phone]

## 2014-12-06 NOTE — ED Notes (Signed)
Patient transported to X-ray 

## 2014-12-06 NOTE — Discharge Instructions (Signed)
Costochondritis °Costochondritis, sometimes called Tietze syndrome, is a swelling and irritation (inflammation) of the tissue (cartilage) that connects your ribs with your breastbone (sternum). It causes pain in the chest and rib area. Costochondritis usually goes away on its own over time. It can take up to 6 weeks or longer to get better, especially if you are unable to limit your activities. °CAUSES  °Some cases of costochondritis have no known cause. Possible causes include: °· Injury (trauma). °· Exercise or activity such as lifting. °· Severe coughing. °SIGNS AND SYMPTOMS °· Pain and tenderness in the chest and rib area. °· Pain that gets worse when coughing or taking deep breaths. °· Pain that gets worse with specific movements. °DIAGNOSIS  °Your health care provider will do a physical exam and ask about your symptoms. Chest X-rays or other tests may be done to rule out other problems. °TREATMENT  °Costochondritis usually goes away on its own over time. Your health care provider may prescribe medicine to help relieve pain. °HOME CARE INSTRUCTIONS  °· Avoid exhausting physical activity. Try not to strain your ribs during normal activity. This would include any activities using chest, abdominal, and side muscles, especially if heavy weights are used. °· Apply ice to the affected area for the first 2 days after the pain begins. °¨ Put ice in a plastic bag. °¨ Place a towel between your skin and the bag. °¨ Leave the ice on for 20 minutes, 2-3 times a day. °· Only take over-the-counter or prescription medicines as directed by your health care provider. °SEEK MEDICAL CARE IF: °· You have redness or swelling at the rib joints. These are signs of infection. °· Your pain does not go away despite rest or medicine. °SEEK IMMEDIATE MEDICAL CARE IF:  °· Your pain increases or you are very uncomfortable. °· You have shortness of breath or difficulty breathing. °· You cough up blood. °· You have worse chest pains,  sweating, or vomiting. °· You have a fever or persistent symptoms for more than 2-3 days. °· You have a fever and your symptoms suddenly get worse. °MAKE SURE YOU:  °· Understand these instructions. °· Will watch your condition. °· Will get help right away if you are not doing well or get worse. °Document Released: 03/05/2005 Document Revised: 03/16/2013 Document Reviewed: 12/28/2012 °ExitCare® Patient Information ©2015 [employer]. This information is not intended to replace advice given to you by your health care provider. Make sure you discuss any questions you have with your health care provider. ° ° °Emergency Department Resource Guide °1) Find a Doctor and Pay Out of Pocket °Although you won't have to find out who is covered by your insurance plan, it is a good idea to ask around and get recommendations. You will then need to call the office and see if the doctor you have chosen will accept you as a new patient and what types of options they offer for patients who are self-pay. Some doctors offer discounts or will set up payment plans for their patients who do not have insurance, but you will need to ask so you aren't surprised when you get to your appointment. ° °2) Contact Your Local Health Department °Not all health departments have doctors that can see patients for sick visits, but many do, so it is worth a call to see if yours does. If you don't know where your local health department is, you can check in your phone book. The CDC also has a tool to help you   locate your state's health department, and many state websites also have listings of all of their local health departments. ° °3) Find a Walk-in Clinic °If your illness is not likely to be very severe or complicated, you may want to try a walk in clinic. These are popping up all over the country in pharmacies, drugstores, and shopping centers. They're usually staffed by nurse practitioners or physician assistants that have been trained to treat  common illnesses and complaints. They're usually fairly quick and inexpensive. However, if you have serious medical issues or chronic medical problems, these are probably not your best option. ° °No Primary Care Doctor: °- Call [facility] at  832-8000 - they can help you locate a primary care doctor that  accepts your insurance, provides certain services, etc. °- Physician Referral Service- 1-800-533-3463 ° °Chronic Pain Problems: °Organization         Address  Phone   Notes  °[facility]  (336) 297-2271 Patients need to be referred by their primary care doctor.  ° °Medication Assistance: °Organization         Address  Phone   Notes  °[place] [facility] [address] °[place], [place] [address] (336) 641-8030 --Must be a resident of [place] °-- Must have NO insurance coverage whatsoever (no Medicaid/ Medicare, etc.) °-- The pt. MUST have a primary care doctor that directs their care regularly and follows them in the community °  °[facility]  (866) 331-1348   °[employer] Way  (888) 892-1162   ° °Agencies that provide inexpensive medical care: °Organization         Address  Phone   Notes  °[facility]  (336) 832-8035   °[facility]    (336) 832-7272   °[facility] [address] °[place], [place] [address] (336) 832-4777   °[facility] [address], °[place] (336) 271-4999   °[facility]    (336) 373-0678   °[facility]    (336) 272-1050   °[facility] ° [address], [place] Phone:  (336) 832-4444, Fax:  (336) 832-4440 Hours of Operation:  9 am - 6 pm, M-F.  Also accepts Medicaid/Medicare and self-pay.  °[facility] ° [address], [place] Phone: (336) 832-3150, Fax: (336) 832-3151. Hours of Operation:  8:30 am - 5:30 pm, M-F.  Also accepts Medicaid and self-pay.  °[facility] [place] [address], [place] Phone: (336) 878-6027   °[facility] [address], [place], [place] (336)723-1848, Ext. 123 Mondays & Thursdays: 7-9 AM.  First 15 patients are seen on a first come, first serve basis. °  ° °Medicaid-accepting [place] Providers: ° °Organization         Address  Phone   Notes  °[facility] [address], [place] (336) 641-2100 Also accepts self-pay patients.  °[facility] [address], [place] ° (336) 856-9996   °[facility] [address], [place] (336) 288-8857   °[facility] [address] [place] [address], [place] (336) 299-7000   °Veita Bland [address], [place]  ° (336) 373-1557 Only accepts [place] Access Medicaid patients after they have their name applied to their card.  ° °Self-Pay (no insurance) in [place]: ° °Organization           Address  Phone   Notes  °Sickle Cell Patients, [facility] [address], [place] (336) 832-1970   °[facility] [address], [place] (336) 832-4400   °[facility] [place] ° [address], [place] (336) 992-4800   °[facility]/Dr. Osei-Bonsu ° [address] [place] [address], [place] or [address], [place] (336) 841-8500 Phone number for both [place] and [place] locations is the same.  °[facility] [address], [place] (336) 299-0000   °[facility] [address] [place] [address], [place] or [address] (336) 852-7530 °(336) 878-2260   °[facility] [address], [place] (336) 350-1642, phone; (336) 294-5005, fax Sees patients 1st and 3rd Saturday of every month.  Must not qualify for public or private insurance (i.e. Medicaid, Medicare, [place] Health Choice, Veterans' Benefits) • Household income should be no more than 200% of the  poverty level •The clinic cannot treat you if you are pregnant or think you are pregnant • Sexually transmitted diseases are not treated at the clinic.  ° ° °Dental Care: °Organization         Address  Phone  Notes  °[facility] [address], [place] (336) 641-6152 Accepts children up to age 21 who are enrolled in Medicaid or [place] Health Choice; pregnant women with a Medicaid card; and children who have applied for Medicaid or [place] Health Choice, but were declined, whose parents can pay a reduced fee at time of service.  °[facility] [place]  [address], [place] (336) 641-7733 Accepts children up to age 21 who are enrolled in Medicaid or [place] Health Choice; pregnant women with a Medicaid card; and children who have applied for Medicaid or [place] Health Choice, but were declined, whose parents can pay a reduced fee at time of service.  °[facility] ° [address], [place] (336) 641-4533 Patients are seen by appointment only. Walk-ins are not accepted. [facility] will see patients 18 years of age and older. °Monday - Tuesday (8am-5pm) °Most Wednesdays (8:30-5pm) °$30 per visit, cash only  °[facility] ° [address], [place] (336) 641-4533 Patients are seen by appointment only. Walk-ins are not accepted. [facility] will see patients 18 years of age and older. °One Wednesday Evening (Monthly: Volunteer Based).  $30 per visit, cash only  °[facility]  (919) 537-3737 for adults; Children under age 4, call Graduate Pediatric Dentistry at ([phone]) [phone]. Children aged 4-14, please call ([phone]) [phone] to request a pediatric application. ° Dental services are provided in all areas of dental care including fillings, crowns and bridges, complete and partial dentures, implants, gum treatment, root canals, and extractions.  Preventive care is also provided. Treatment is provided to both adults and children. °Patients are selected via a lottery and there is often a waiting list. °  °[facility] [address], °[place] ° (336) 763-8833 [email or web address] °  °[facility] [address], [place], [place] (336)723-1848, Ext. 123 Second and Fourth Thursday of each month, opens at 6:30 AM; Clinic ends at 9 AM.  Patients are seen on a first-come first-served basis, and a limited number are seen during each clinic.  ° °[facility] ° [address], [place], [place] (336) 723-7904   Eligibility Requirements °You must have lived in [place], [place], or [place] counties for at least the last three months. °  You cannot be eligible for state or federal sponsored healthcare insurance, including Veterans Administration, Medicaid, or Medicare. °  You generally cannot be eligible for healthcare insurance through your employer.  °  How to apply: °Eligibility screenings are held every Tuesday and Wednesday afternoon from 1:00 pm until 4:00 pm. You do not need an appointment for the interview!  °[facility] [address], [place], [place] 336-631-2330   °[facility]  336-342-8273   °[facility]  336-703-3100   °[facility]  336-570-6415   ° °Behavioral Health Resources in the Community: °Intensive Outpatient Programs °Organization         Address  Phone  Notes  °[facility] [address], [place], [place] 336-878-6098   °[facility] [address], [place], [place] 336-832-9800   °[facility] [address], [place] ° 336-882-2125   °[facility] [address],  °[place], [place] 1-800-853-5163 or 336-641-4981   °Substance Abuse Resources °Organization         Address  Phone  Notes  °[facility]  336-882-2125   °[facility]  336-784-9470   °[facility]  336-285-9073   °[facility]  336-845-3988   °Residential & Outpatient Substance Abuse Program  1-800-659-3381   °Psychological Services °Organization         Address  Phone  Notes  °[facility]  336- 832-9600   °[facility]  336- 378-7881   °[facility] [address], [place] 1-800-853-5163 or 336-641-4981   ° °Mobile Crisis Teams °Organization         Address  Phone  Notes  °[facility], Mobile Crisis Care Unit  1-877-626-1772   °Assertive °[facility] ° [address] [place], [place] 336-834-9664   °Sharon DeEsch [address] °[place] 336-554-5454   ° °Self-Help/Support Groups °Organization         Address  Phone             Notes  °[facility] [place] - variety of support groups  336- 373-1402 Call for more information  °Narcotics Anonymous (NA), [facility] [address], °[place]  2 meetings at this location  ° °Residential Treatment Programs °Organization         Address  Phone  Notes  °[facility] [address],    °[place]  1-866-801-8205   °[facility] ° [address], [place], [place] 704-293-8524   °[facility] [address], [place] 336-845-3988 Admissions: 8am-3pm M-F  °[facility] [address]    °[place], [place] 336-841-1104   °[facility] [address], [place], [place] 336-379-7146   °[facility] [address]  °[place], [place] 336-285-9073   °[facility] - Intensive Outpatient [address], [place], [place] 336-852-3033   °[facility] [address]  °[place], [place] 1-877-615-2722 or 336-784-9470   °[facility] [address] [place], [place] 336-227-7417 Accepts Medicaid  °[facility] [address]  ° [place]  1-800-659-3381 Substance Abuse/Addiction Treatment  ° °[place] Behavioral Health Resources °Organization           Address  Phone  Notes  °[facility]  (888) 581-9988   °Julie Brannon, PhD [address] [place], [place]   (336) 349-5553 or (336) 951-0000   °[facility]   [address] °[place], [place] (336) 349-4454   °[facility] [address], [place], [place] (336) 342-8316 Insurance/Medicaid/sponsorship through [facility]  °[facility] [address]                                    [place], [place] (336) 342-8316 Therapy/tele-psych/case  °[facility] [address]  ° [place], [place] (336) 349-2233    °Dr. Arfeen  (336) 349-4544   °[facility] [place]  [employer] [place] [facility] 1) [address],  °2) [address], [place] °3)  [address], [place] (336) 349-3220 °(336) 342-7768 ° °(336) 342-8140   °[place] [facility] (336) 342-1394 or (336) 342-3537 (After Hours)    ° ° ° °

## 2014-12-06 NOTE — ED Notes (Addendum)
Pt reports recent productive cough and now has left side chest pain that occurs when moving, breathing, coughing. No distress noted at triage.

## 2015-06-20 ENCOUNTER — Encounter (HOSPITAL_COMMUNITY): Payer: Self-pay | Admitting: *Deleted

## 2015-06-20 ENCOUNTER — Emergency Department (HOSPITAL_COMMUNITY)
Admission: EM | Admit: 2015-06-20 | Discharge: 2015-06-20 | Discharge status: Against Medical Advice | Payer: Medicaid Other | Attending: Emergency Medicine | Admitting: Emergency Medicine

## 2015-06-20 DIAGNOSIS — F17210 Nicotine dependence, cigarettes, uncomplicated: Secondary | ICD-10-CM | POA: Insufficient documentation

## 2015-06-20 DIAGNOSIS — R112 Nausea with vomiting, unspecified: Secondary | ICD-10-CM | POA: Insufficient documentation

## 2015-06-20 DIAGNOSIS — R6883 Chills (without fever): Secondary | ICD-10-CM | POA: Insufficient documentation

## 2015-06-20 DIAGNOSIS — R52 Pain, unspecified: Secondary | ICD-10-CM | POA: Insufficient documentation

## 2015-06-20 MED ORDER — ONDANSETRON 4 MG PO TBDP
ORAL_TABLET | ORAL | Status: AC
  Filled 2015-06-20: qty 1

## 2015-06-20 MED ORDER — ONDANSETRON 4 MG PO TBDP
4.0000 mg | ORAL_TABLET | Freq: Once | ORAL | Status: AC
  Administered 2015-06-20: 4 mg via ORAL

## 2015-06-20 NOTE — ED Notes (Signed)
Pt refused blood work in triage.  

## 2015-06-20 NOTE — ED Notes (Signed)
Patient called for repeat vital sign and room 3x with no answer

## 2015-06-20 NOTE — ED Notes (Signed)
[facility] work [facility].

## 2015-06-20 NOTE — ED Notes (Signed)
Pt reports N/V, body aches, and chills.

## 2015-08-01 ENCOUNTER — Encounter (HOSPITAL_COMMUNITY): Payer: Self-pay | Admitting: Emergency Medicine

## 2015-08-01 ENCOUNTER — Emergency Department (HOSPITAL_COMMUNITY)
Admission: EM | Admit: 2015-08-01 | Discharge: 2015-08-02 | Disposition: A | Discharge status: Home / Self Care | Payer: Medicaid Other | Attending: Emergency Medicine | Admitting: Emergency Medicine

## 2015-08-01 DIAGNOSIS — F17210 Nicotine dependence, cigarettes, uncomplicated: Secondary | ICD-10-CM | POA: Insufficient documentation

## 2015-08-01 DIAGNOSIS — Z202 Contact with and (suspected) exposure to infections with a predominantly sexual mode of transmission: Secondary | ICD-10-CM | POA: Insufficient documentation

## 2015-08-01 NOTE — ED Notes (Signed)
Pt states his girlfriend tested positive for trich and he wants to be tested for STDs  Pt denies any symptoms

## 2015-08-02 LAB — URINE MICROSCOPIC-ADD ON: Squamous Epithelial / LPF: NONE SEEN

## 2015-08-02 LAB — URINALYSIS, ROUTINE W REFLEX MICROSCOPIC
Bilirubin Urine: NEGATIVE
Glucose, UA: NEGATIVE mg/dL
Ketones, ur: NEGATIVE mg/dL
Leukocytes, UA: NEGATIVE
Nitrite: NEGATIVE
Protein, ur: NEGATIVE mg/dL
Specific Gravity, Urine: 1.024 (ref 1.005–1.030)
pH: 5.5 (ref 5.0–8.0)

## 2015-08-02 MED ORDER — ONDANSETRON 4 MG PO TBDP
4.0000 mg | ORAL_TABLET | Freq: Once | ORAL | Status: AC
  Administered 2015-08-02: 4 mg via ORAL
  Filled 2015-08-02: qty 1

## 2015-08-02 MED ORDER — METRONIDAZOLE 500 MG PO TABS
2000.0000 mg | ORAL_TABLET | Freq: Once | ORAL | Status: AC
  Administered 2015-08-02: 2000 mg via ORAL
  Filled 2015-08-02: qty 4

## 2015-08-02 NOTE — ED Notes (Signed)
Pt left before receiving d/c paperwork, signing, and d/c vital signs.

## 2015-08-02 NOTE — ED Provider Notes (Signed)
CSN: [id number]     Arrival date & time 08/01/15  2318 History  By signing my name below, I, Bethel Born, attest that this documentation has been prepared under the direction and in the presence of Raeford Razor, MD. Electronically Signed: Bethel Born, ED Scribe. 08/02/2015. 12:35 AM    Chief Complaint  Patient presents with  . Exposure to STD   The history is provided by the patient. No language interpreter was used.   Johnny Huff is a 25 y.o. male who presents to the Emergency Department for evaluation after exposure to an STD. Pt states that his girlfriend, who he has unprotected sex with, tested positive for trichomonas. Pt denies any symptoms including fever, penile discharge, and dysuria. NKDA.   History reviewed. No pertinent past medical history. History reviewed. No pertinent past surgical history. History reviewed. No pertinent family history. Social History  Substance Use Topics  . Smoking status: Current Every Day Smoker -- 1.00 packs/day    Types: Cigarettes  . Smokeless tobacco: None  . Alcohol Use: No    Review of Systems  Genitourinary: Negative for dysuria and discharge.  All other systems reviewed and are negative.   Allergies  Review of patient's allergies indicates no known allergies.  Home Medications   Prior to Admission medications   Medication Sig Start Date End Date Taking? Authorizing Provider  methocarbamol (ROBAXIN) 500 MG tablet Take 1 tablet (500 mg total) by mouth 2 (two) times daily. Patient not taking: Reported on 08/01/2015 12/06/14   Fayrene Helper, PA-C  naproxen (NAPROSYN) 500 MG tablet Take 1 tablet (500 mg total) by mouth 2 (two) times daily. Patient not taking: Reported on 08/01/2015 12/06/14   Fayrene Helper, PA-C   BP 118/68 mmHg  Pulse 90  Temp(Src) 97.9 F (36.6 C) (Oral)  Resp 18  Ht  (1.88 m)  Wt 150 lb (68.04 kg)  BMI 19.25 kg/m2  SpO2 98% Physical Exam  Constitutional: He is oriented to person, place, and time. He  appears well-developed and well-nourished. No distress.  HENT:  Head: Normocephalic and atraumatic.  Eyes: EOM are normal.  Neck: Normal range of motion.  Cardiovascular: Normal rate, regular rhythm, normal heart sounds and intact distal pulses.   Pulmonary/Chest: Effort normal and breath sounds normal. No respiratory distress.  Abdominal: Soft. He exhibits no distension.  Genitourinary:  Normal external male genitalia. No concerning lesions noted. No discharge.  Musculoskeletal: Normal range of motion.  Neurological: He is alert and oriented to person, place, and time.  Skin: Skin is warm and dry. He is not diaphoretic.  Psychiatric: He has a normal mood and affect. Judgment normal.  Nursing note and vitals reviewed.   ED Course  Procedures (including critical care time) DIAGNOSTIC STUDIES: Oxygen Saturation is 98% on RA,  normal by my interpretation.    COORDINATION OF CARE: 12:21 AM Discussed treatment plan which includes UA, empiric STD treatment with Flagyl, and Zofran with pt at bedside and pt agreed to plan.  Labs Review Labs Reviewed  URINALYSIS, ROUTINE W REFLEX MICROSCOPIC (NOT AT [facility] - Abnormal; Notable for the following:    Hgb urine dipstick LARGE (*)    All other components within normal limits  URINE MICROSCOPIC-ADD ON - Abnormal; Notable for the following:    Bacteria, UA RARE (*)    All other components within normal limits  GC/CHLAMYDIA PROBE AMP ([place]) NOT AT [facility]    Imaging Review No results found. I have personally reviewed and evaluated these  lab results as part of my medical decision-making.   EKG Interpretation None      MDM   Final diagnoses:  Exposure to STD   25 year old male presenting after being told by sexual partner that she had trichomonas. He is requesting. Treatment. I feel this is reasonable. We will also check for gonorrhea and chlamydia but is declining empiric treatment for these as well. He is declining HIV  testing.   Raeford Razor, MD 08/12/15 [phone]

## 2015-08-02 NOTE — Discharge Instructions (Signed)
Safe Sex  °Safe sex is about reducing the risk of giving or getting a sexually transmitted disease (STD). STDs are spread through sexual contact involving the genitals, mouth, or rectum. Some STDs can be cured and others cannot. Safe sex can also prevent unintended pregnancies.  °WHAT ARE SOME SAFE SEX PRACTICES?  °Limit your sexual activity to only one partner who is having sex with only you.  °Talk to your partner about his or her past partners, past STDs, and drug use.  °Use a condom every time you have sexual intercourse. This includes vaginal, oral, and anal sexual activity. Both females and males should wear condoms during oral sex. Only use latex or polyurethane condoms and water-based lubricants. Using petroleum-based lubricants or oils to lubricate a condom will weaken the condom and increase the chance that it will break. The condom should be in place from the beginning to the end of sexual activity. Wearing a condom reduces, but does not completely eliminate, your risk of getting or giving an STD. STDs can be spread by contact with infected body fluids and skin.  °Get vaccinated for hepatitis B and HPV.  °Avoid alcohol and recreational drugs, which can affect your judgment. You may forget to use a condom or participate in high-risk sex.  °For females, avoid douching after sexual intercourse. Douching can spread an infection farther into the reproductive tract.  °Check your body for signs of sores, blisters, rashes, or unusual discharge. See your health care provider if you notice any of these signs.  °Avoid sexual contact if you have symptoms of an infection or are being treated for an STD. If you or your partner has herpes, avoid sexual contact when blisters are present. Use condoms at all other times.  °If you are at risk of being infected with HIV, it is recommended that you take a prescription medicine daily to prevent HIV infection. This is called pre-exposure prophylaxis (PrEP). You are considered  at risk if:  °You are a man who has sex with other men (MSM).  °You are a heterosexual man or woman who is sexually active with more than one partner.  °You take drugs by injection.  °You are sexually active with a partner who has HIV. °Talk with your health care provider about whether you are at high risk of being infected with HIV. If you choose to begin PrEP, you should first be tested for HIV. You should then be tested every 3 months for as long as you are taking PrEP.  °See your health care provider for regular screenings, exams, and tests for other STDs. Before having sex with a new partner, each of you should be screened for STDs and should talk about the results with each other. °WHAT ARE THE BENEFITS OF SAFE SEX?  °There is less chance of getting or giving an STD.  °You can prevent unwanted or unintended pregnancies.  °By discussing safe sex concerns with your partner, you may increase feelings of intimacy, comfort, trust, and honesty between the two of you. °This information is not intended to replace advice given to you by your health care provider. Make sure you discuss any questions you have with your health care provider.  °Document Released: 07/03/2004 Document Revised: 06/16/2014 Document Reviewed: 11/17/2011  °Elsevier Interactive Patient Education ©2016 Elsevier Inc.  ° ° °Emergency Department Resource Guide °1) Find a Doctor and Pay Out of Pocket °Although you won't have to find out who is covered by your insurance plan, it is a good   idea to ask around and get recommendations. You will then need to call the office and see if the doctor you have chosen will accept you as a new patient and what types of options they offer for patients who are self-pay. Some doctors offer discounts or will set up payment plans for their patients who do not have insurance, but you will need to ask so you aren't surprised when you get to your appointment. ° °2) Contact Your Local Health Department °Not all health  departments have doctors that can see patients for sick visits, but many do, so it is worth a call to see if yours does. If you don't know where your local health department is, you can check in your phone book. The CDC also has a tool to help you locate your state's health department, and many state websites also have listings of all of their local health departments. ° °3) Find a Walk-in Clinic °If your illness is not likely to be very severe or complicated, you may want to try a walk in clinic. These are popping up all over the country in pharmacies, drugstores, and shopping centers. They're usually staffed by nurse practitioners or physician assistants that have been trained to treat common illnesses and complaints. They're usually fairly quick and inexpensive. However, if you have serious medical issues or chronic medical problems, these are probably not your best option. ° °No Primary Care Doctor: °- Call Health Connect at  832-8000 - they can help you locate a primary care doctor that  accepts your insurance, provides certain services, etc. °- Physician Referral Service- 1-800-533-3463 ° °Chronic Pain Problems: °Organization         Address  Phone   Notes  °[facility]  (336) 297-2271 Patients need to be referred by their primary care doctor.  ° °Medication Assistance: °Organization         Address  Phone   Notes  °[place] [facility] [address] °[place], [place] [address] (336) 641-8030 --Must be a resident of [place] °-- Must have NO insurance coverage whatsoever (no Medicaid/ Medicare, etc.) °-- The pt. MUST have a primary care doctor that directs their care regularly and follows them in the community °  °[facility]  (866) 331-1348   °United Way  (888) 892-1162   ° °Agencies that provide inexpensive medical care: °Organization         Address  Phone   Notes  °[facility]  (336) 832-8035   °[facility]     (336) 832-7272   °[facility] [address] °[place], [place] [address] (336) 832-4777   °[facility] [address], °[place] (336) 271-4999   °[facility]    (336) 373-0678   °[facility]    (336) 272-1050   °[facility] ° [address], [place] Phone:  (336) 832-4444, Fax:  (336) 832-4440 Hours of Operation:  9 am - 6 pm, M-F.  Also accepts Medicaid/Medicare and self-pay.  °[facility] ° [address], [place] Phone: (336) 832-3150, Fax: (336) [phone]. Hours of Operation:  8:30 am - 5:30 pm, M-F.  Also accepts Medicaid and self-pay.  °[facility] [place] [address], [place] Phone: (336) 878-6027   °[facility] [address], [place], [place] (336)723-1848, Ext. 123 Mondays & Thursdays: 7-9 AM.  First 15   First 15 patients are seen on a first come, first serve basis.    Medicaid-accepting [facility] Providers:  Organization         Address  Phone   Notes  [facility] [address],  [phone] Also accepts self-pay patients.  [facility] [address] Laurell Josephs [place], [place]  [phone]   [facility] [address], [place] [phone]   [facility] [address] [place] [phone]   Renaye Rakers [address], [place]   ([phone] Only accepts [place] patients after they have their name applied to their card.   Self-Pay (no insurance) in [facility]:  Organization         Address  Phone   Notes  Sickle Cell Patients, [facility] [address] [place], [place] [phone]   [facility] [address] [place], [place] [phone]   [facility] [place]  [address],  [place] [phone]   [facility]/Dr. Osei-Bonsu  [address] [place] or [address], [place] [phone] Phone number for both [place] and [place] locations is the same.  [facility]gent [facility] and [facility] [address], [place] [phone]   [facility] [address] [place] or [address] [phone]   [facility] [address] [place] [phone], phone; [phone], fax Sees patients 1st and 3rd Saturday of every month.  Must not qualify for public or private insurance (i.e. Medicaid, Medicare, [place] Health Choice, Veterans' Benefits)  Household income should be no more than 200% of the poverty level The clinic cannot treat you if you are pregnant or think you are pregnant  Sexually transmitted diseases are not treated at the clinic.    Dental Care: Organization         Address  Phone  Notes  [facility] of [facility] [address] [place], [place] [phone] Accepts children up to age 56 who are enrolled in [place] or [place] Health Choice; pregnant women with a Medicaid card; and children who have applied for Medicaid or [place] Health Choice, but were declined, whose parents can pay a reduced fee at time of service.  [facility] Department of [facility]  [address], [place] [phone] Accepts children up to age 40 who are enrolled in [place] or [place] Health Choice; pregnant women with a Medicaid card; and children who have applied for Medicaid or [place] Health Choice, but were declined, whose parents can pay a reduced fee at time of service.  [facility]  [address] [place], [place] [phone] Patients are seen by appointment only. Walk-ins are not accepted. [facility] will see patients 34 years of age and older. Monday - Tuesday (8am-5pm) Most Wednesdays  (8:30-5pm) $30 per visit, cash only  [facility]  [address], [facility] [phone] Patients are seen by appointment only. Walk-ins are not accepted. [facility] will see patients 33 years of age and older. One Wednesday Evening (Monthly: Volunteer Based).  $30 per visit, cash only  [employer]  [phone] for adults; Children under age 37, call Graduate  Pediatric Dentistry at [phone]. Children aged 84-14, please call [phone]) [phone] to request a pediatric application.  Dental services are provided in all areas of dental care including fillings, crowns and bridges, complete and partial dentures, implants, gum treatment, root canals, and extractions. Preventive care is also provided. Treatment is provided to both adults and children. Patients are selected via a lottery and there is often a waiting list.   [facility] [address], [place]  [phone]) [phone] [email or web address]   [facility] [address], [place], [place] [phone], Ext. [phone] Second and Fourth Thursday of each month, opens at 6:30 AM; Clinic ends at 9 AM.  Patients are seen on a first-come first-served basis, and a limited number are seen during each clinic.   [facility]  [address], [place], [place] [phone]   Eligibility Requirements You must have lived in [place], [place], or [place] counties for at least the last three months.   You cannot be eligible for state or federal sponsored [place]healthcare insurance, including CIG[employer]eterans Administration, [place]icaid, or [employer]icare.   You generally cannot be eligible for healthcare insurance through your employer.    How to apply: Eligibility screenings are held every Tuesday and Wednesday afternoon from 1:00 pm until 4:00 pm. You do not need an appointment for the interview!  [facility] [address], [place], [place] [phone]   [facility] [place]  [facility] Department  [phone]   [facility]  [phone]   [facility]  [phone]    Behavioral Health Resources in the Community: Intensive Outpatient Programs Organization         Address  Phone  Notes  [facility] [address], [place], [place] [phone]   [facility] [address], [place], [place] [phone]   [facility] [address], [place], [place]  [phone]   [facility] [address],  [place], [place] [phone] or [phone]   Substance Abuse Resources Organization         Address  Phone  Notes  [facility]  [phone]   [facility]  [phone]   [facility]  (938)[phone]   [facility]  ([phone]   Residential & Outpatient Substance Abuse Program  [phone]   Psychological Services Organization         Address  Phone  Notes  [facility]  [phone]- [phone]   [facility]  [phone]- [phone]   [facility] [address], [place] [phone] or [phone]    Mobile Crisis Teams Organization         Address  Phone  Notes  [facility], Mobile Crisis Care Unit  [phone]   [facility]  [address] [place], [place] [phone]   Doristine LocksSharon DeEsch [address] [address] [place] [phone]    Self-Help/Support Groups Organization         Address  Phone             Notes  [facility] [place] - variety of support groups  336- [phone] Call for more information  Narcotics Anonymous (NA), [facility] [address], [place]  2 meetings at this location   [facility] Treatment Programs Organization         Address  Phone  Notes  [facility] [address],  [place]  [phone]   °[facility] ° [address], [place], [place] [phone]    °[facility] [address], [place] [phone] Admissions: 8am-3pm M-F  °[facility] [address]    °[place], [place] [phone]   °[facility] [address], [place], [place] [phone]   °[facility] [address]  °[place], [place] [phone]   °[facility] - Intensive Outpatient [address], [place], [place] [phone]   °[facility] ([facility] [address]  °[place], [place] 1-877-615-2722 or [phone]   °[facility] ([facility]) [address] [place], [place] [phone] Accepts Medicaid  °[facility] [address]  °[place] 1-800-659-3381 Substance Abuse/Addiction Treatment  ° °[place] Behavioral Health Resources °Organization         Address  Phone  Notes  °[facility]  (888) 581-9988   °Julie Brannon, PhD [address] [place], [place]   (336) 349-5553 or (336) 951-0000   °[facility]   [address] °[place], [place] (336) 349-4454   °[facility] [address], [place], [place] (336) [phone] Insurance/Medicaid/sponsorship through [facility]  °[facility] [address]                                    [place], [place] (336) [phone] Therapy/tele-psych/case  °[facility] [address]  ° [place], [place] (336) [phone]    °Dr. Arfeen  (336) [phone]   °[facility] [place]  [employer] [place] [facility] 1) [address], [place] °2) [address], [place] °3)  [address], [place] (336) 349-3220 °(336) 342-7768 ° °(336) 342-8140   °[place] Child Abuse Hotline (336) 342-1394 or (336) 342-3537 (After Hours)    ° ° ° °

## 2015-08-03 LAB — GC/CHLAMYDIA PROBE AMP (~~LOC~~) NOT AT ARMC
Chlamydia: NEGATIVE
Neisseria Gonorrhea: NEGATIVE

## 2015-10-09 ENCOUNTER — Encounter (HOSPITAL_COMMUNITY): Payer: Self-pay | Admitting: Emergency Medicine

## 2015-10-09 ENCOUNTER — Emergency Department (HOSPITAL_COMMUNITY)
Admission: EM | Admit: 2015-10-09 | Discharge: 2015-10-09 | Disposition: A | Discharge status: Home / Self Care | Payer: Medicaid Other | Attending: Emergency Medicine | Admitting: Emergency Medicine

## 2015-10-09 DIAGNOSIS — N390 Urinary tract infection, site not specified: Secondary | ICD-10-CM | POA: Insufficient documentation

## 2015-10-09 DIAGNOSIS — F17210 Nicotine dependence, cigarettes, uncomplicated: Secondary | ICD-10-CM | POA: Insufficient documentation

## 2015-10-09 LAB — URINALYSIS, ROUTINE W REFLEX MICROSCOPIC
Bilirubin Urine: NEGATIVE
Glucose, UA: NEGATIVE mg/dL
Hgb urine dipstick: NEGATIVE
Ketones, ur: NEGATIVE mg/dL
NITRITE: NEGATIVE
Protein, ur: NEGATIVE mg/dL
Specific Gravity, Urine: 1.021 (ref 1.005–1.030)
pH: 6 (ref 5.0–8.0)

## 2015-10-09 LAB — URINE MICROSCOPIC-ADD ON

## 2015-10-09 MED ORDER — CEPHALEXIN 500 MG PO CAPS: 500.0000 mg | ORAL_CAPSULE | Freq: Two times a day (BID) | ORAL | Status: AC

## 2015-10-09 NOTE — ED Notes (Signed)
PA at bedside. Pt refuses testing for STD check.

## 2015-10-09 NOTE — Discharge Instructions (Signed)

## 2015-10-09 NOTE — ED Notes (Signed)
Burning with urination starting a few days ago. States he has had recent unprotected sex, denies any new discharge or itching

## 2015-10-09 NOTE — ED Notes (Signed)
Swab for specimen at bedside.

## 2015-10-09 NOTE — ED Provider Notes (Signed)
CSN: [id number]     Arrival date & time 10/09/15  0855 History   First MD Initiated Contact with Patient 10/09/15 1026     Chief Complaint  Patient presents with  . Dysuria   HPI  [name] is a 25 year old male presenting with dysuria. Onset of symptoms was 3 days ago. He reports severe pain on urination. Denies malodorous urine or hematuria. He admits to recent unprotected intercourse with one male partner. He is unsure of her STD status. He does note that he was seen in February of this year due to exposure to Trichomonas. He was treated with Flagyl at that time. He does not believe he has been exposed to STDs with his current partner and declines STD testing at this time. Denies fevers, chills, abdominal pain, nausea, vomiting, constipation, diarrhea, penile discharge, penile tenderness, testicular tenderness or lesions in the groin. He has no other complaints today. Denies urological history.  History reviewed. No pertinent past medical history. History reviewed. No pertinent past surgical history. History reviewed. No pertinent family history. Social History  Substance Use Topics  . Smoking status: Current Every Day Smoker -- 1.00 packs/day    Types: Cigarettes  . Smokeless tobacco: None  . Alcohol Use: No    Review of Systems  All other systems reviewed and are negative.     Allergies  Review of patient's allergies indicates no known allergies.  Home Medications   Prior to Admission medications   Medication Sig Start Date End Date Taking? Authorizing Provider  cephALEXin (KEFLEX) 500 MG capsule Take 1 capsule (500 mg total) by mouth 2 (two) times daily. 10/09/15   Jermiah Soderman, PA-C  methocarbamol (ROBAXIN) 500 MG tablet Take 1 tablet (500 mg total) by mouth 2 (two) times daily. Patient not taking: Reported on 08/01/2015 12/06/14   Fayrene HelperBowie Tran, PA-C  naproxen (NAPROSYN) 500 MG tablet Take 1 tablet (500 mg total) by mouth 2 (two) times daily. Patient not taking: Reported on  08/01/2015 12/06/14   Fayrene HelperBowie Tran, PA-C   BP 119/73 mmHg  Pulse 66  Temp(Src) 97.5 F (36.4 C) (Oral)  Resp 16  Ht 6\' 1"  (1.854 m)  Wt 65.772 kg  BMI 19.13 kg/m2  SpO2 100% Physical Exam  Constitutional: He appears well-developed and well-nourished. No distress.  Nontoxic-appearing  HENT:  Head: Normocephalic and atraumatic.  Right Ear: External ear normal.  Left Ear: External ear normal.  Eyes: Conjunctivae are normal. Right eye exhibits no discharge. Left eye exhibits no discharge. No scleral icterus.  Neck: Normal range of motion.  Cardiovascular: Normal rate.   Pulmonary/Chest: Effort normal.  Abdominal: Soft. He exhibits no distension. There is no tenderness.  Genitourinary:  Patient fully clothed. Declines GU exam stating he just needs treatment for UTI.  Musculoskeletal: Normal range of motion.  Moves all extremities spontaneously  Neurological: He is alert. Coordination normal.  Skin: Skin is warm and dry.  Psychiatric: He has a normal mood and affect. His behavior is normal.  Nursing note and vitals reviewed.   ED Course  Procedures (including critical care time) Labs Review Labs Reviewed  URINALYSIS, ROUTINE W REFLEX MICROSCOPIC (NOT AT [facility]) - Abnormal; Notable for the following:    Leukocytes, UA MODERATE (*)    All other components within normal limits  URINE MICROSCOPIC-ADD ON - Abnormal; Notable for the following:    Squamous Epithelial / LPF 0-5 (*)    Bacteria, UA RARE (*)    All other components within normal limits  Imaging Review No results found. I have personally reviewed and evaluated these images and lab results as part of my medical decision-making.   EKG Interpretation None      MDM   Final diagnoses:  UTI (lower urinary tract infection)   25 year old male presenting with dysuria 3 days. Admits to unprotected intercourse but is not concerned for STDs at this time. Afebrile and nontoxic appearing. Abdomen is soft, nontender without  peritoneal signs. Patient declines GU exam. Urinalysis with moderate leukocytes and rare bacteria. Discussed STD testing with patient who declines blood work or urethral swab. Offered prophylactic STD treatment and patient also declines stating "if it comes back positive off the urine then y'all will just call me". Will send urine for culture and GC. Urged patient to follow-up with the health department for regular STD testing even history of unprotected intercourse. Will discharge with Keflex. Return precautions given in discharge paperwork and discussed with pt at bedside. Pt stable for discharge     Alveta Heimlich, PA-C 10/09/15 1115  Lorre Nick, MD 10/12/15 1415

## 2015-10-10 LAB — URINE CULTURE: Culture: NO GROWTH

## 2016-02-08 ENCOUNTER — Emergency Department (HOSPITAL_COMMUNITY)
Admission: EM | Admit: 2016-02-08 | Discharge: 2016-02-08 | Disposition: A | Discharge status: Home / Self Care | Payer: Medicaid Other | Attending: Emergency Medicine | Admitting: Emergency Medicine

## 2016-02-08 ENCOUNTER — Encounter (HOSPITAL_COMMUNITY): Payer: Self-pay | Admitting: *Deleted

## 2016-02-08 DIAGNOSIS — Z792 Long term (current) use of antibiotics: Secondary | ICD-10-CM | POA: Insufficient documentation

## 2016-02-08 DIAGNOSIS — Z791 Long term (current) use of non-steroidal anti-inflammatories (NSAID): Secondary | ICD-10-CM | POA: Insufficient documentation

## 2016-02-08 DIAGNOSIS — Z79899 Other long term (current) drug therapy: Secondary | ICD-10-CM | POA: Insufficient documentation

## 2016-02-08 DIAGNOSIS — Z202 Contact with and (suspected) exposure to infections with a predominantly sexual mode of transmission: Secondary | ICD-10-CM | POA: Insufficient documentation

## 2016-02-08 MED ORDER — AZITHROMYCIN 1 G PO PACK
1.0000 g | PACK | Freq: Once | ORAL | Status: AC
  Administered 2016-02-08: 1 g via ORAL
  Filled 2016-02-08: qty 1

## 2016-02-08 NOTE — ED Triage Notes (Signed)
Pt reports his girlfriend reported that she was treated yesterday for STD.  Denies any sxs at this time.

## 2016-02-08 NOTE — ED Provider Notes (Signed)
[facility]-EMERGENCY DEPT Provider Note   CSN: [id number] Arrival date & time: 02/08/16  0816     History   Chief Complaint Chief Complaint  Patient presents with  . Exposure to STD    HPI Johnny Huff is a 25 y.o. male.  The history is provided by the patient. No language interpreter was used.  Exposure to STD    Johnny Huff is a 25 y.o. male who presents to the Emergency Department complaining of Chlamydia exposure. His girlfriend was diagnosed with chlamydia yesterday. He denies any dysuria, penile discharge, abdominal pain, fevers, nausea, vomiting. He states he is sexually active with 2 partners and his girlfriend is currently pregnant. She was diagnosed with Chlamydia on routine screening at her OB/GYN.   History reviewed. No pertinent past medical history.  There are no active problems to display for this patient.   History reviewed. No pertinent surgical history.     Home Medications    Prior to Admission medications   Medication Sig Start Date End Date Taking? Authorizing Provider  cephALEXin (KEFLEX) 500 MG capsule Take 1 capsule (500 mg total) by mouth 2 (two) times daily. 10/09/15   Stevi Barrett, PA-C  methocarbamol (ROBAXIN) 500 MG tablet Take 1 tablet (500 mg total) by mouth 2 (two) times daily. Patient not taking: Reported on 08/01/2015 12/06/14   Fayrene HelperBowie Tran, PA-C  naproxen (NAPROSYN) 500 MG tablet Take 1 tablet (500 mg total) by mouth 2 (two) times daily. Patient not taking: Reported on 08/01/2015 12/06/14   Fayrene HelperBowie Tran, PA-C    Family History No family history on file.  Social History Social History  Substance Use Topics  . Smoking status: Current Every Day Smoker    Packs/day: 1.00    Types: Cigarettes  . Smokeless tobacco: Never Used  . Alcohol use No     Allergies   Review of patient's allergies indicates no known allergies.   Review of Systems Review of Systems  All other systems reviewed and are negative.    Physical Exam Updated  Vital Signs BP 117/80 (BP Location: Left Arm)   Pulse 69   Temp 97.6 F (36.4 C) (Oral)   Ht 6\' 1"  (1.854 m)   Wt 155 lb (70.3 kg)   SpO2 100%   BMI 20.45 kg/m   Physical Exam  Constitutional: He is oriented to person, place, and time. He appears well-developed and well-nourished. No distress.  HENT:  Head: Normocephalic and atraumatic.  Neck: Neck supple.  Cardiovascular: Normal rate and regular rhythm.   No murmur heard. Pulmonary/Chest: Effort normal and breath sounds normal. No respiratory distress.  Abdominal: Soft. He exhibits no mass. There is no tenderness. There is no rebound.  Musculoskeletal: He exhibits no edema or tenderness.  Neurological: He is alert and oriented to person, place, and time.  Skin: Skin is warm and dry. Capillary refill takes less than 2 seconds.  Psychiatric: He has a normal mood and affect. His behavior is normal.  Nursing note and vitals reviewed.    ED Treatments / Results  Labs (all labs ordered are listed, but only abnormal results are displayed) Labs Reviewed  GC/CHLAMYDIA PROBE AMP ([place]) NOT AT [facility]    EKG  EKG Interpretation None       Radiology No results found.  Procedures Procedures (including critical care time)  Medications Ordered in ED Medications  azithromycin (ZITHROMAX) powder 1 g (1 g Oral Given 02/08/16 0914)     Initial Impression / Assessment and Plan /  ED Course  I have reviewed the triage vital signs and the nursing notes.  Pertinent labs & imaging results that were available during my care of the patient were reviewed by me and considered in my medical decision making (see chart for details).  Clinical Course    Patient here following exposure to chlamydia. He is asymmetric in emergency department. Discussed with patient recommendations by CDC for testing for HIV and syphilis and patient declines. Also discussed recommendations for treatment of possible co-infection with gonorrhea with  Rocephin and patient declines injection. We will test for gonorrhea and chlamydia and treat for chlamydia given his exposure with 1 g of azithromycin by mouth. Discussed with patient safe sexual practices, informing additional partner of potential exposure and outpatient follow-up.  Final Clinical Impressions(s) / ED Diagnoses   Final diagnoses:  STD exposure    New Prescriptions Discharge Medication List as of 02/08/2016  9:01 AM       Tilden Fossa, MD 02/08/16 1005

## 2016-02-12 LAB — GC/CHLAMYDIA PROBE AMP (~~LOC~~) NOT AT ARMC
CHLAMYDIA, DNA PROBE: POSITIVE — AB
Neisseria Gonorrhea: NEGATIVE

## 2016-02-13 ENCOUNTER — Telehealth: Payer: Self-pay | Admitting: *Deleted

## 2016-02-13 NOTE — Telephone Encounter (Signed)
Spoke with patient, verified ID, informed of labs, treated per protocol, DHHS form faxed, patient informed to abstain for sexual activity x 10 days and notify sexual partners for evaluation and treatment 

## 2016-04-05 ENCOUNTER — Encounter (HOSPITAL_COMMUNITY): Payer: Self-pay | Admitting: *Deleted

## 2016-04-05 ENCOUNTER — Emergency Department (HOSPITAL_COMMUNITY)
Admission: EM | Admit: 2016-04-05 | Discharge: 2016-04-06 | Disposition: A | Discharge status: Home / Self Care | Payer: Medicaid Other | Attending: Emergency Medicine | Admitting: Emergency Medicine

## 2016-04-05 DIAGNOSIS — J029 Acute pharyngitis, unspecified: Secondary | ICD-10-CM | POA: Insufficient documentation

## 2016-04-05 DIAGNOSIS — B9789 Other viral agents as the cause of diseases classified elsewhere: Secondary | ICD-10-CM

## 2016-04-05 DIAGNOSIS — F17210 Nicotine dependence, cigarettes, uncomplicated: Secondary | ICD-10-CM | POA: Insufficient documentation

## 2016-04-05 DIAGNOSIS — J069 Acute upper respiratory infection, unspecified: Secondary | ICD-10-CM | POA: Insufficient documentation

## 2016-04-05 NOTE — ED Triage Notes (Signed)
Patient c/o sore throat. 

## 2016-04-05 NOTE — ED Provider Notes (Signed)
[facility]-EMERGENCY DEPT Provider Note   CSN: [id number] Arrival date & time: 04/05/16  2335     History   Chief Complaint Chief Complaint  Patient presents with  . Sore Throat    HPI Johnny Huff is a 25 y.o. male who presents to the ED with sore throat that started yesterday. Patient reports taking OTC medication without relief. Patient reports that his children have been sick with URI and have similar symptoms.   The history is provided by the patient. No language interpreter was used.  Sore Throat  This is a new problem. The current episode started yesterday. The problem occurs constantly. The problem has been gradually worsening. Pertinent negatives include no chest pain, no abdominal pain, no headaches and no shortness of breath. The symptoms are aggravated by eating and swallowing. Nothing relieves the symptoms.    History reviewed. No pertinent past medical history.  There are no active problems to display for this patient.   History reviewed. No pertinent surgical history.     Home Medications    Prior to Admission medications   Medication Sig Start Date End Date Taking? Authorizing Provider  benzonatate (TESSALON) 100 MG capsule Take 1 capsule (100 mg total) by mouth every 8 (eight) hours. 04/06/16   Alleyne Lac Orlene OchM Arlen Dupuis, NP  cephALEXin (KEFLEX) 500 MG capsule Take 1 capsule (500 mg total) by mouth 2 (two) times daily. 10/09/15   Stevi Barrett, PA-C  methocarbamol (ROBAXIN) 500 MG tablet Take 1 tablet (500 mg total) by mouth 2 (two) times daily. Patient not taking: Reported on 08/01/2015 12/06/14   Fayrene HelperBowie Tran, PA-C  naproxen (NAPROSYN) 500 MG tablet Take 1 tablet (500 mg total) by mouth 2 (two) times daily. 04/06/16   Damia Bobrowski Orlene OchM Nirvaan Frett, NP    Family History No family history on file.  Social History Social History  Substance Use Topics  . Smoking status: Current Every Day Smoker    Packs/day: 1.00    Types: Cigarettes  . Smokeless tobacco: Never Used  . Alcohol use No      Allergies   Review of patient's allergies indicates no known allergies.   Review of Systems Review of Systems  Constitutional: Positive for chills. Negative for fever.  HENT: Positive for congestion and sore throat. Negative for dental problem and ear pain.   Eyes: Negative for redness, itching and visual disturbance.  Respiratory: Negative for cough and shortness of breath.   Cardiovascular: Negative for chest pain and palpitations.  Gastrointestinal: Positive for nausea. Negative for abdominal pain and vomiting.  Genitourinary: Negative for dysuria and frequency.  Musculoskeletal: Negative for back pain.  Skin: Negative for rash.  Neurological: Negative for syncope and headaches.  Psychiatric/Behavioral: Negative for confusion.     Physical Exam Updated Vital Signs BP 122/70 (BP Location: Right Arm)   Pulse 86   Temp 98.6 F (37 C) (Oral)   Resp 14   Ht 6\' 1"  (1.854 m)   Wt 70.3 kg   SpO2 98%   BMI 20.45 kg/m   Physical Exam  Constitutional: He is oriented to person, place, and time. He appears well-developed and well-nourished. No distress.  HENT:  Head: Normocephalic and atraumatic.  Right Ear: Tympanic membrane normal.  Left Ear: Tympanic membrane normal.  Nose: Rhinorrhea present.  Mouth/Throat: Uvula is midline and mucous membranes are normal. Posterior oropharyngeal erythema present. No posterior oropharyngeal edema.  Eyes: EOM are normal.  Neck: Neck supple.  Cardiovascular: Normal rate and regular rhythm.   Pulmonary/Chest: Effort normal.  He has no wheezes. He has no rales.  Abdominal: Soft. There is no tenderness.  Musculoskeletal: Normal range of motion.  Lymphadenopathy:    He has no cervical adenopathy.  Neurological: He is alert and oriented to person, place, and time. No cranial nerve deficit.  Skin: Skin is warm and dry.  Psychiatric: He has a normal mood and affect. His behavior is normal.  Nursing note and vitals reviewed.    ED  Treatments / Results  Labs (all labs ordered are listed, but only abnormal results are displayed) Labs Reviewed  RAPID STREP SCREEN (NOT AT [facility])  CULTURE, GROUP A STREP [facility]    Radiology No results found.  Procedures Procedures (including critical care time)  Medications Ordered in ED Medications - No data to display   Initial Impression / Assessment and Plan / ED Course  I have reviewed the triage vital signs and the nursing notes.  Pertinent labs results that were available during my care of the patient were reviewed by me and considered in my medical decision making (see chart for details).  Clinical Course   25 y.o. male with sore throat, congestion and dry cough stable for d/c without fever and does not appear toxic. Rx for Tessalon and discussed with the patient cool mist vaporizer, ibuprofen for discomfort and f/u with PCP. Return precautions given. Final Clinical Impressions(s) / ED Diagnoses   Final diagnoses:  Viral URI with cough  Sore throat    New Prescriptions Discharge Medication List as of 04/06/2016 12:58 AM    START taking these medications   Details  benzonatate (TESSALON) 100 MG capsule Take 1 capsule (100 mg total) by mouth every 8 (eight) hours., Starting Sun 04/06/2016, [address]         Glorious Flicker M [place], NP 04/07/16 [id number]    Shon Batonourtney F Horton, MD 04/09/16 [phone]

## 2016-04-06 LAB — RAPID STREP SCREEN (MED CTR MEBANE ONLY): Streptococcus, Group A Screen (Direct): NEGATIVE

## 2016-04-06 MED ORDER — BENZONATATE 100 MG PO CAPS: 100.0000 mg | ORAL_CAPSULE | Freq: Three times a day (TID) | ORAL | 0 refills | Status: AC

## 2016-04-06 MED ORDER — NAPROXEN 500 MG PO TABS: 500.0000 mg | ORAL_TABLET | Freq: Two times a day (BID) | ORAL | 0 refills | Status: AC

## 2016-04-07 LAB — CULTURE, GROUP A STREP (THRC)

## 2017-07-31 ENCOUNTER — Other Ambulatory Visit: Payer: Self-pay

## 2017-07-31 ENCOUNTER — Encounter (HOSPITAL_COMMUNITY): Payer: Self-pay | Admitting: Emergency Medicine

## 2017-07-31 ENCOUNTER — Emergency Department (HOSPITAL_COMMUNITY)
Admission: EM | Admit: 2017-07-31 | Discharge: 2017-07-31 | Disposition: A | Discharge status: Home / Self Care | Payer: Self-pay | Attending: Emergency Medicine | Admitting: Emergency Medicine

## 2017-07-31 DIAGNOSIS — Z202 Contact with and (suspected) exposure to infections with a predominantly sexual mode of transmission: Secondary | ICD-10-CM | POA: Insufficient documentation

## 2017-07-31 DIAGNOSIS — F17210 Nicotine dependence, cigarettes, uncomplicated: Secondary | ICD-10-CM | POA: Insufficient documentation

## 2017-07-31 MED ORDER — AZITHROMYCIN 250 MG PO TABS
1000.0000 mg | ORAL_TABLET | Freq: Once | ORAL | Status: AC
  Administered 2017-07-31: 1000 mg via ORAL
  Filled 2017-07-31: qty 4

## 2017-07-31 NOTE — Discharge Instructions (Signed)
As we discussed, you received treatment today for chlamydia.  It does not cover gonorrhea.  Results will take 24-48 hours to return.  If you are positive, he will be notified.  If the results are negative, he will not be notified.  Inform your partner.  Do not engage in any sexual intercourse until you and your partner have both successfully completed treatment.  Return to the ED for any fevers, testicular pain or swelling, penile discharge, penile pain or any other worsening or concerning symptoms.

## 2017-07-31 NOTE — ED Triage Notes (Signed)
Pt verbalizes girlfriend has STD; denies symptoms.

## 2017-07-31 NOTE — ED Provider Notes (Signed)
[facility]-EMERGENCY DEPT Provider Note   CSN: [id number] Arrival date & time: 07/31/17  1211     History   Chief Complaint Chief Complaint  Patient presents with  . Exposure to STD    HPI Johnny Huff is a 27 y.o. male who presents for evaluation of exposure to STD.  Patient reports that his wife reported that she was positive for chlamydia.  Patient reports that he is asymptomatic at this time but states that given concerns of exposure, wanted to be tested and treated in the ED.  Patient states that he is currently sexually active with his wife only.  They do not use protection.  Patient denies any dysuria, hematuria, testicular pain or swelling, penile discharge, rash, lesion.  The history is provided by the patient.    History reviewed. No pertinent past medical history.  There are no active problems to display for this patient.   History reviewed. No pertinent surgical history.     Home Medications    Prior to Admission medications   Medication Sig Start Date End Date Taking? Authorizing Provider  benzonatate (TESSALON) 100 MG capsule Take 1 capsule (100 mg total) by mouth every 8 (eight) hours. 04/06/16   Janne NapoleonNeese, Hope M, NP  cephALEXin (KEFLEX) 500 MG capsule Take 1 capsule (500 mg total) by mouth 2 (two) times daily. 10/09/15   Barrett, Rolm GalaStevi, PA-C  methocarbamol (ROBAXIN) 500 MG tablet Take 1 tablet (500 mg total) by mouth 2 (two) times daily. Patient not taking: Reported on 08/01/2015 12/06/14   Fayrene Helperran, Bowie, PA-C  naproxen (NAPROSYN) 500 MG tablet Take 1 tablet (500 mg total) by mouth 2 (two) times daily. 04/06/16   Janne NapoleonNeese, Hope M, NP    Family History No family history on file.  Social History Social History   Tobacco Use  . Smoking status: Current Every Day Smoker    Packs/day: 1.00    Types: Cigarettes  . Smokeless tobacco: Never Used  Substance Use Topics  . Alcohol use: No  . Drug use: No     Allergies   Patient has no known  allergies.   Review of Systems Review of Systems  Genitourinary: Negative for dysuria, hematuria, penile swelling, scrotal swelling and testicular pain.     Physical Exam Updated Vital Signs BP 124/68 (BP Location: Left Arm)   Pulse 73   Temp (!) 97.5 F (36.4 C) (Oral)   Resp 16   SpO2 98%   Physical Exam  Constitutional: He appears well-developed and well-nourished.  HENT:  Head: Normocephalic and atraumatic.  Eyes: Conjunctivae and EOM are normal. Right eye exhibits no discharge. Left eye exhibits no discharge. No scleral icterus.  Pulmonary/Chest: Effort normal.  Genitourinary: Testes normal. Right testis shows no swelling and no tenderness. Left testis shows no swelling and no tenderness. Circumcised.  Genitourinary Comments: The exam was performed with a chaperone present. Normal external male genitalia. No lesions, rash, or sores.  No testicular pain, swelling, tenderness.  Neurological: He is alert.  Skin: Skin is warm and dry.  Psychiatric: He has a normal mood and affect. His speech is normal and behavior is normal.  Nursing note and vitals reviewed.    ED Treatments / Results  Labs (all labs ordered are listed, but only abnormal results are displayed) Labs Reviewed  GC/CHLAMYDIA PROBE AMP ([place]) NOT AT [facility]    EKG  EKG Interpretation None       Radiology No results found.  Procedures Procedures (including critical  care time)  Medications Ordered in ED Medications  azithromycin (ZITHROMAX) tablet 1,000 mg (1,000 mg Oral Given 07/31/17 1538)     Initial Impression / Assessment and Plan / ED Course  I have reviewed the triage vital signs and the nursing notes.  Pertinent labs & imaging results that were available during my care of the patient were reviewed by me and considered in my medical decision making (see chart for details).     27 year old male who presents for evaluation of exposure to STD.  Reports that wife was positive for  chlamydia and comes the ED seeking treatment.  No symptoms at this time. Patient is afebrile, non-toxic appearing, sitting comfortably on examination table. Vital signs reviewed and stable.  Normal GU exam.  No evidence of rash, lesions.  GC chlamydia test sent.  I discussed treatment options including prophylactic treatment in the ED.  Patient does want treatment in the ED. Patient with no known drug allergies. I discussed with him the treatment for both ceftriaxone and azithromycin.  Patient does not wish to have ceftriaxone treatment at this time as he does not want to stop.  I explained to him that we would be treating for both gonorrhea and chlamydia with both of these antibiotics and that not doing both would not be completing the treatment.  Patient understands and wishes only to be treated for chlamydia.  Patient treated in the ED.  Discussed that results will return in the next 24-48 hours for further evaluation.  Instructed patient to abstain from sexual intercourse until both he and his partner have completed treatment. Patient had ample opportunity for questions and discussion. All patient's questions were answered with full understanding. Strict return precautions discussed. Patient expresses understanding and agreement to plan.   Final Clinical Impressions(s) / ED Diagnoses   Final diagnoses:  STD exposure    ED Discharge Orders    None       Rosana Hoes 07/31/17 1541    Charlynne Pander, MD 07/31/17 [phone]

## 2017-08-03 LAB — GC/CHLAMYDIA PROBE AMP (~~LOC~~) NOT AT ARMC
Chlamydia: POSITIVE — AB
NEISSERIA GONORRHEA: NEGATIVE

## 2017-12-09 ENCOUNTER — Emergency Department (HOSPITAL_COMMUNITY)
Admission: EM | Admit: 2017-12-09 | Discharge: 2017-12-09 | Discharge status: Against Medical Advice | Payer: Self-pay | Attending: Emergency Medicine | Admitting: Emergency Medicine

## 2017-12-09 ENCOUNTER — Encounter (HOSPITAL_COMMUNITY): Payer: Self-pay | Admitting: Emergency Medicine

## 2017-12-09 DIAGNOSIS — Z5320 Procedure and treatment not carried out because of patient's decision for unspecified reasons: Secondary | ICD-10-CM | POA: Insufficient documentation

## 2017-12-09 DIAGNOSIS — A64 Unspecified sexually transmitted disease: Secondary | ICD-10-CM

## 2017-12-09 LAB — URINALYSIS, ROUTINE W REFLEX MICROSCOPIC
BILIRUBIN URINE: NEGATIVE
Bacteria, UA: NONE SEEN
Glucose, UA: NEGATIVE mg/dL
KETONES UR: NEGATIVE mg/dL
Nitrite: NEGATIVE
PROTEIN: NEGATIVE mg/dL
Specific Gravity, Urine: 1.031 — ABNORMAL HIGH (ref 1.005–1.030)
pH: 5 (ref 5.0–8.0)

## 2017-12-09 MED ORDER — CEFTRIAXONE SODIUM 250 MG IJ SOLR: 250.0000 mg | Freq: Once | INTRAMUSCULAR | Status: DC

## 2017-12-09 MED ORDER — AZITHROMYCIN 250 MG PO TABS
1000.0000 mg | ORAL_TABLET | Freq: Once | ORAL | Status: AC
  Administered 2017-12-09: 1000 mg via ORAL
  Filled 2017-12-09: qty 4

## 2017-12-09 NOTE — ED Notes (Signed)
Patient walked out of room asking about results. Informed patient we would be discharging shortly. Patient walked out of ED. [facility].

## 2017-12-09 NOTE — ED Notes (Signed)
Bed: [facility] Expected date:  Expected time:  Means of arrival:  Comments: 

## 2017-12-09 NOTE — ED Provider Notes (Signed)
[facility]-EMERGENCY DEPT Provider Note   CSN: [id number] Arrival date & time: 12/09/17  2040     History   Chief Complaint Chief Complaint  Patient presents with  . Penile Discharge  . Dysuria    HPI Johnny Huff is a 27 y.o. male who presents to the ED for STD check. Patient reports burning with urination and discharge x 2 weeks. Hx of Chlamydia 2 months ago. Patient here with his girlfriend and admits to both having multiple sex partners.  HPI  History reviewed. No pertinent past medical history.  There are no active problems to display for this patient.   History reviewed. No pertinent surgical history.      Home Medications    Prior to Admission medications   Medication Sig Start Date End Date Taking? Authorizing Provider  benzonatate (TESSALON) 100 MG capsule Take 1 capsule (100 mg total) by mouth every 8 (eight) hours. 04/06/16   Janne Napoleon, NP  cephALEXin (KEFLEX) 500 MG capsule Take 1 capsule (500 mg total) by mouth 2 (two) times daily. 10/09/15   Barrett, Rolm Gala, PA-C  methocarbamol (ROBAXIN) 500 MG tablet Take 1 tablet (500 mg total) by mouth 2 (two) times daily. Patient not taking: Reported on 08/01/2015 12/06/14   Fayrene Helper, PA-C  naproxen (NAPROSYN) 500 MG tablet Take 1 tablet (500 mg total) by mouth 2 (two) times daily. 04/06/16   Janne Napoleon, NP    Family History No family history on file.  Social History Social History   Tobacco Use  . Smoking status: Current Every Day Smoker    Packs/day: 1.00    Types: Cigarettes  . Smokeless tobacco: Never Used  Substance Use Topics  . Alcohol use: No  . Drug use: No     Allergies   Patient has no known allergies.   Review of Systems Review of Systems  Genitourinary: Positive for discharge and dysuria. Negative for frequency and urgency.  All other systems reviewed and are negative.    Physical Exam Updated Vital Signs BP 123/75 (BP Location: Left Arm)   Pulse 70    Temp 98.2 F (36.8 C) (Oral)   Resp 14   Ht 6\' 1"  (1.854 m)   Wt 63.5 kg (140 lb)   SpO2 99%   BMI 18.47 kg/m   Physical Exam  Constitutional: He is oriented to person, place, and time. He appears well-developed and well-nourished. No distress.  HENT:  Head: Normocephalic.  Eyes: EOM are normal.  Neck: Neck supple.  Cardiovascular: Normal rate.  Pulmonary/Chest: Effort normal.  Genitourinary: Testes normal. Circumcised. No penile erythema or penile tenderness. Discharge found.  Musculoskeletal: Normal range of motion.  Lymphadenopathy: Inguinal adenopathy noted on the right and left side.  Neurological: He is alert and oriented to person, place, and time. No cranial nerve deficit.  Skin: Skin is warm and dry.  Psychiatric: He has a normal mood and affect.  Nursing note and vitals reviewed.    ED Treatments / Results  Labs (all labs ordered are listed, but only abnormal results are displayed) Labs Reviewed  URINALYSIS, ROUTINE W REFLEX MICROSCOPIC - Abnormal; Notable for the following components:      Result Value   Specific Gravity, Urine 1.031 (*)    Hgb urine dipstick MODERATE (*)    Leukocytes, UA SMALL (*)    All other components within normal limits  GC/CHLAMYDIA PROBE AMP ([place]) NOT AT [facility]    Radiology No results found.  Procedures  Procedures (including critical care time)  Medications Ordered in ED Medications  azithromycin (ZITHROMAX) tablet 1,000 mg (1,000 mg Oral Given 12/09/17 2202)   Patient declined Rocephin and declined blood draw for HIV and syphilis. Patient does request treatment with Zithromax.   Initial Impression / Assessment and Plan / ED Course  I have reviewed the triage vital signs and the nursing notes. Pt presents with concerns for possible STD.  Pt understands that they have GC/Chlamydia cultures pending and that they will need to inform all sexual partners if results return positive. Pt has been treated prophylactically with  azithromycin  Patient to be discharged with instructions to follow up with [facility]. Discussed importance of using protection when sexually active.   Final Clinical Impressions(s) / ED Diagnoses   Final diagnoses:  STD (male)    ED Discharge Orders    None       Janne Napoleoneese, Hope M, NP 12/10/17 1345    Melene PlanFloyd, Dan, DO 12/12/17 0246

## 2017-12-09 NOTE — ED Triage Notes (Signed)
Patient reports burning with urination and green penile discharge x2 weeks. Reports hx chlamydia.

## 2017-12-11 LAB — GC/CHLAMYDIA PROBE AMP (~~LOC~~) NOT AT ARMC
CHLAMYDIA, DNA PROBE: NEGATIVE
Neisseria Gonorrhea: NEGATIVE

## 2017-12-15 NOTE — ED Notes (Signed)
12/15/2017, Pt. Left message for a return call to discuss results.  Called pt. No answer, voicemail is full.

## 2017-12-15 NOTE — ED Notes (Signed)
12/15/2017, Pt. Called and requested results of his STD.  Results reviewed with pt. Pt. Verbalized understanding and all questions answered.

## 2019-02-24 ENCOUNTER — Other Ambulatory Visit: Payer: Self-pay

## 2019-02-24 ENCOUNTER — Emergency Department (HOSPITAL_COMMUNITY): Admission: EM | Admit: 2019-02-24 | Discharge: 2019-02-24 | Discharge status: Against Medical Advice | Payer: Self-pay

## 2019-02-24 NOTE — ED Notes (Signed)
I called patient name in the lobby no one responded
